# Patient Record
Sex: Female | Born: 1965 | Hispanic: No | Smoking: Current every day smoker
Health system: Southern US, Community
[De-identification: ages and names within clinical notes are randomized; demographics above are authoritative.]

## PROBLEM LIST (undated history)

## (undated) DIAGNOSIS — R569 Unspecified convulsions: Secondary | ICD-10-CM

## (undated) DIAGNOSIS — M1990 Unspecified osteoarthritis, unspecified site: Secondary | ICD-10-CM

## (undated) DIAGNOSIS — G4730 Sleep apnea, unspecified: Secondary | ICD-10-CM

## (undated) DIAGNOSIS — F32A Depression, unspecified: Secondary | ICD-10-CM

## (undated) DIAGNOSIS — M810 Age-related osteoporosis without current pathological fracture: Secondary | ICD-10-CM

## (undated) DIAGNOSIS — IMO0002 Reserved for concepts with insufficient information to code with codable children: Secondary | ICD-10-CM

## (undated) DIAGNOSIS — J449 Chronic obstructive pulmonary disease, unspecified: Secondary | ICD-10-CM

## (undated) DIAGNOSIS — F329 Major depressive disorder, single episode, unspecified: Secondary | ICD-10-CM

## (undated) DIAGNOSIS — F419 Anxiety disorder, unspecified: Secondary | ICD-10-CM

## (undated) DIAGNOSIS — G709 Myoneural disorder, unspecified: Secondary | ICD-10-CM

## (undated) HISTORY — DX: Depression, unspecified: F32.A

## (undated) HISTORY — DX: Sleep apnea, unspecified: G47.30

## (undated) HISTORY — DX: Unspecified osteoarthritis, unspecified site: M19.90

## (undated) HISTORY — DX: Reserved for concepts with insufficient information to code with codable children: IMO0002

## (undated) HISTORY — DX: Unspecified convulsions: R56.9

## (undated) HISTORY — DX: Myoneural disorder, unspecified: G70.9

## (undated) HISTORY — DX: Anxiety disorder, unspecified: F41.9

## (undated) HISTORY — DX: Major depressive disorder, single episode, unspecified: F32.9

## (undated) HISTORY — DX: Age-related osteoporosis without current pathological fracture: M81.0

## (undated) HISTORY — DX: Chronic obstructive pulmonary disease, unspecified: J44.9

---

## 2007-04-24 HISTORY — PX: ROTATOR CUFF REPAIR: SHX139

## 2016-06-15 ENCOUNTER — Ambulatory Visit (INDEPENDENT_AMBULATORY_CARE_PROVIDER_SITE_OTHER): Payer: Medicaid Other | Admitting: Family Medicine

## 2016-06-15 ENCOUNTER — Encounter: Payer: Self-pay | Admitting: Family Medicine

## 2016-06-15 VITALS — BP 130/82 | HR 98 | Temp 98.0°F | Ht 67.0 in | Wt 206.0 lb

## 2016-06-15 DIAGNOSIS — M797 Fibromyalgia: Secondary | ICD-10-CM

## 2016-06-15 DIAGNOSIS — G43909 Migraine, unspecified, not intractable, without status migrainosus: Secondary | ICD-10-CM

## 2016-06-15 DIAGNOSIS — M0579 Rheumatoid arthritis with rheumatoid factor of multiple sites without organ or systems involvement: Secondary | ICD-10-CM

## 2016-06-15 DIAGNOSIS — K5904 Chronic idiopathic constipation: Secondary | ICD-10-CM | POA: Diagnosis not present

## 2016-06-15 MED ORDER — FLUTICASONE-SALMETEROL 250-50 MCG/DOSE IN AEPB: 1.0000 | INHALATION_SPRAY | Freq: Two times a day (BID) | RESPIRATORY_TRACT | 5 refills | Status: AC

## 2016-06-15 MED ORDER — SERTRALINE HCL 100 MG PO TABS: 100.0000 mg | ORAL_TABLET | Freq: Every day | ORAL | 5 refills | Status: DC

## 2016-06-15 MED ORDER — FOLIC ACID 1 MG PO TABS: 1.0000 mg | ORAL_TABLET | Freq: Every day | ORAL | 5 refills | Status: AC

## 2016-06-15 MED ORDER — LINACLOTIDE 290 MCG PO CAPS: 290.0000 ug | ORAL_CAPSULE | Freq: Every day | ORAL | 5 refills | Status: DC

## 2016-06-15 MED ORDER — METHOTREXATE SODIUM 2.5 MG PO TABS: 2.5000 mg | ORAL_TABLET | ORAL | 5 refills | Status: DC

## 2016-06-15 MED ORDER — VITAMIN D 1000 UNITS PO TABS: 1000.0000 [IU] | ORAL_TABLET | Freq: Every day | ORAL | 5 refills | Status: AC

## 2016-06-15 MED ORDER — SIMVASTATIN 40 MG PO TABS: 40.0000 mg | ORAL_TABLET | Freq: Every day | ORAL | 5 refills | Status: DC

## 2016-06-15 MED ORDER — MELOXICAM 15 MG PO TABS: 15.0000 mg | ORAL_TABLET | Freq: Every day | ORAL | 5 refills | Status: DC

## 2016-06-15 MED ORDER — BUSPIRONE HCL 15 MG PO TABS: 15.0000 mg | ORAL_TABLET | Freq: Three times a day (TID) | ORAL | 5 refills | Status: DC

## 2016-06-15 MED ORDER — PANTOPRAZOLE SODIUM 40 MG PO TBEC: 40.0000 mg | DELAYED_RELEASE_TABLET | Freq: Every day | ORAL | 5 refills | Status: DC

## 2016-06-15 MED ORDER — FUROSEMIDE 40 MG PO TABS: 40.0000 mg | ORAL_TABLET | Freq: Every day | ORAL | 5 refills | Status: AC

## 2016-06-15 MED ORDER — ALBUTEROL SULFATE HFA 108 (90 BASE) MCG/ACT IN AERS: 1.0000 | INHALATION_SPRAY | Freq: Four times a day (QID) | RESPIRATORY_TRACT | 6 refills | Status: AC | PRN

## 2016-06-15 NOTE — Progress Notes (Signed)
Subjective:  Patient ID: Jessica Vaughn, female    DOB: 1965-08-25  Age: 51 y.o. MRN: [id number]  CC: New Patient (Initial Visit) (pt here today to establish care and needs referral to Rheumatologist.)   HPI Jessica Vaughn presents for Concern for pain of chronic disease - Rheumatoid arthritis and fibromyalgia. She has had the conditions for years. Is transfering care here. Needs to see rheumatology. Has taken methotrexate in the past. Is out. Doesn't help as much as it used to. Interested in trying the newer meds such as humira. Has not used embrel either. Pain is all over, but worst at the hands and shoulders. Rated 8/10. Aching sensation. No burning, numbness or cramping.   Ot. Also has problems with chonic constioation. She frequently goes two weeks without a BM in spite of use of multiple OTC remedies. These include dulcolax, miralax, probiotics, MOM, prunes, fiber drinks, correctol, citrucel, colace, mineral oil, senocot, and Mg Citrate.She hasn't had a colonoscopy. Abd. Feels bloated all the time.   History Jessica Vaughn has a past medical history of Anxiety; Arthritis; COPD (chronic obstructive pulmonary disease) ([place] [place]); Depression; Neuromuscular disorder ([place]); Osteoporosis; Seizures ([place]); Sleep apnea; and Ulcer ([place]).   She has a past surgical history that includes Rotator cuff repair (Left, 2009).   Her family history includes Cancer in her father; Depression in her mother; Diabetes in her father; Drug abuse in her sister; Hypertension in her father.She reports that she has been smoking Cigarettes.  She has never used smokeless tobacco. She reports that she drinks about 0.6 oz of alcohol per week . She reports that she does not use drugs.  No current outpatient prescriptions on file prior to visit.   No current facility-administered medications on file prior to visit.     ROS Review of Systems  Constitutional: Positive for fatigue. Negative for activity change, appetite change  and fever.  HENT: Negative for congestion, rhinorrhea and sore throat.   Eyes: Negative for visual disturbance.  Respiratory: Negative for cough and shortness of breath.   Cardiovascular: Negative for chest pain and palpitations.  Gastrointestinal: Positive for abdominal distention, abdominal pain and nausea. Negative for anal bleeding, blood in stool, diarrhea, rectal pain and vomiting.  Endocrine: Negative for cold intolerance and heat intolerance.  Genitourinary: Negative for dysuria and flank pain.  Musculoskeletal: Positive for arthralgias, back pain, gait problem, joint swelling and myalgias.  Neurological: Positive for weakness (generalized, nonfocal) and headaches. Negative for dizziness.    Objective:  BP 130/82   Pulse 98   Temp 98 F (36.7 C) (Oral)   Ht 5' 7"  (1.702 m)   Wt 206 lb (93.4 kg)   BMI 32.26 kg/m   Physical Exam  Constitutional: She is oriented to person, place, and time. She appears well-developed and well-nourished. No distress.  HENT:  Head: Normocephalic and atraumatic.  Right Ear: External ear normal.  Left Ear: External ear normal.  Nose: Nose normal.  Mouth/Throat: Oropharynx is clear and moist.  Eyes: Conjunctivae and EOM are normal. Pupils are equal, round, and reactive to light.  Neck: Normal range of motion. Neck supple. No thyromegaly present.  Cardiovascular: Normal rate, regular rhythm and normal heart sounds.   No murmur heard. Pulmonary/Chest: Effort normal and breath sounds normal. No respiratory distress. She has no wheezes. She has no rales.  Abdominal: Soft. Bowel sounds are normal. She exhibits no distension. There is no tenderness.  Lymphadenopathy:    She has no cervical adenopathy.  Neurological: She is alert and oriented  to person, place, and time. She has normal reflexes.  Skin: Skin is warm and dry.  Psychiatric: She has a normal mood and affect. Her behavior is normal. Judgment and thought content normal.    Assessment &  Plan:   Jessica Vaughn was seen today for new patient (initial visit).  Diagnoses and all orders for this visit:  Rheumatoid arthritis involving multiple sites with positive rheumatoid factor (HCC) -     CBC with Differential/Platelet -     CMP14+EGFR -     Folate -     Vitamin B12 -     VITAMIN D 25 Hydroxy (Vit-D Deficiency, Fractures) -     Sedimentation rate -     Rheumatoid factor -     Ambulatory referral to Rheumatology  Fibromyalgia -     CBC with Differential/Platelet -     CMP14+EGFR -     Folate -     Vitamin B12 -     VITAMIN D 25 Hydroxy (Vit-D Deficiency, Fractures) -     Sedimentation rate -     Rheumatoid factor  Migraine without status migrainosus, not intractable, unspecified migraine type  Chronic idiopathic constipation -     Ambulatory referral to Gastroenterology  Other orders -     albuterol (PROVENTIL HFA;VENTOLIN HFA) 108 (90 Base) MCG/ACT inhaler; Inhale 1 puff into the lungs every 6 (six) hours as needed for wheezing or shortness of breath. -     busPIRone (BUSPAR) 15 MG tablet; Take 1 tablet (15 mg total) by mouth 3 (three) times daily. -     cholecalciferol (VITAMIN D) 1000 units tablet; Take 1 tablet (1,000 Units total) by mouth daily. -     Fluticasone-Salmeterol (ADVAIR) 250-50 MCG/DOSE AEPB; Inhale 1 puff into the lungs 2 (two) times daily. -     folic acid (FOLVITE) 1 MG tablet; Take 1 tablet (1 mg total) by mouth daily. -     furosemide (LASIX) 40 MG tablet; Take 1 tablet (40 mg total) by mouth daily. -     meloxicam (MOBIC) 15 MG tablet; Take 1 tablet (15 mg total) by mouth daily. -     methotrexate 2.5 MG tablet; Take 1 tablet (2.5 mg total) by mouth once a week. Take 8 tablets once a week. -     pantoprazole (PROTONIX) 40 MG tablet; Take 1 tablet (40 mg total) by mouth daily. -     sertraline (ZOLOFT) 100 MG tablet; Take 1 tablet (100 mg total) by mouth daily. -     simvastatin (ZOCOR) 40 MG tablet; Take 1 tablet (40 mg total) by mouth  daily. -     linaclotide (LINZESS) 290 MCG CAPS capsule; Take 1 capsule (290 mcg total) by mouth daily. To regulate bowel movements   I have changed Jessica Vaughn's busPIRone, cholecalciferol, folic acid, furosemide, meloxicam, methotrexate, pantoprazole, sertraline, and simvastatin. I am also having her start on linaclotide. Additionally, I am having her maintain her SUMAtriptan, topiramate, traMADol, vitamin B-12, predniSONE, albuterol, and Fluticasone-Salmeterol.  Meds ordered this encounter  Medications  . DISCONTD: methotrexate 2.5 MG tablet    Sig: Take 2.5 mg by mouth once a week. Take 8 tablets once a week.  . SUMAtriptan (IMITREX) 50 MG tablet    Sig: Take 50 mg by mouth every 2 (two) hours as needed for migraine. May repeat in 2 hours if headache persists or recurs.  Marland Kitchen DISCONTD: busPIRone (BUSPAR) 15 MG tablet    Sig: Take 15 mg by mouth 3 (  three) times daily.  Marland Kitchen DISCONTD: folic acid (FOLVITE) 1 MG tablet    Sig: Take 1 mg by mouth daily.  Marland Kitchen topiramate (TOPAMAX) 25 MG tablet    Sig: Take 100 mg by mouth at bedtime.  Marland Kitchen DISCONTD: simvastatin (ZOCOR) 40 MG tablet    Sig: Take 40 mg by mouth daily.  Marland Kitchen DISCONTD: ondansetron (ZOFRAN-ODT) 4 MG disintegrating tablet    Sig: Take 4 mg by mouth every 8 (eight) hours as needed for nausea or vomiting.  . traMADol (ULTRAM) 50 MG tablet    Sig: Take 50 mg by mouth every 6 (six) hours as needed. Take 1-2 tablets every 6 hours as needed for pain.  Marland Kitchen DISCONTD: pantoprazole (PROTONIX) 40 MG tablet    Sig: Take 40 mg by mouth daily.  Marland Kitchen DISCONTD: meloxicam (MOBIC) 15 MG tablet    Sig: Take 15 mg by mouth daily.  . vitamin B-12 (CYANOCOBALAMIN) 1000 MCG tablet    Sig: Take 1,000 mcg by mouth daily.  . predniSONE (DELTASONE) 10 MG tablet    Sig: Take 10 mg by mouth daily with breakfast.  . DISCONTD: furosemide (LASIX) 40 MG tablet    Sig: Take 40 mg by mouth daily.  Marland Kitchen DISCONTD: cholecalciferol (VITAMIN D) 1000 units tablet    Sig: Take 1,000  Units by mouth daily.  Marland Kitchen DISCONTD: sertraline (ZOLOFT) 100 MG tablet    Sig: Take 100 mg by mouth daily.  Marland Kitchen DISCONTD: Fluticasone-Salmeterol (ADVAIR) 250-50 MCG/DOSE AEPB    Sig: Inhale 1 puff into the lungs 2 (two) times daily.  Marland Kitchen DISCONTD: albuterol (PROVENTIL HFA;VENTOLIN HFA) 108 (90 Base) MCG/ACT inhaler    Sig: Inhale 1 puff into the lungs every 6 (six) hours as needed for wheezing or shortness of breath.  Marland Kitchen albuterol (PROVENTIL HFA;VENTOLIN HFA) 108 (90 Base) MCG/ACT inhaler    Sig: Inhale 1 puff into the lungs every 6 (six) hours as needed for wheezing or shortness of breath.    Dispense:  1 Inhaler    Refill:  6  . busPIRone (BUSPAR) 15 MG tablet    Sig: Take 1 tablet (15 mg total) by mouth 3 (three) times daily.    Dispense:  30 tablet    Refill:  5  . cholecalciferol (VITAMIN D) 1000 units tablet    Sig: Take 1 tablet (1,000 Units total) by mouth daily.    Dispense:  30 tablet    Refill:  5  . Fluticasone-Salmeterol (ADVAIR) 250-50 MCG/DOSE AEPB    Sig: Inhale 1 puff into the lungs 2 (two) times daily.    Dispense:  60 each    Refill:  5  . folic acid (FOLVITE) 1 MG tablet    Sig: Take 1 tablet (1 mg total) by mouth daily.    Dispense:  30 tablet    Refill:  5  . furosemide (LASIX) 40 MG tablet    Sig: Take 1 tablet (40 mg total) by mouth daily.    Dispense:  30 tablet    Refill:  5  . meloxicam (MOBIC) 15 MG tablet    Sig: Take 1 tablet (15 mg total) by mouth daily.    Dispense:  30 tablet    Refill:  5  . methotrexate 2.5 MG tablet    Sig: Take 1 tablet (2.5 mg total) by mouth once a week. Take 8 tablets once a week.    Dispense:  32 tablet    Refill:  5  . pantoprazole (PROTONIX) 40 MG tablet  Sig: Take 1 tablet (40 mg total) by mouth daily.    Dispense:  30 tablet    Refill:  5  . sertraline (ZOLOFT) 100 MG tablet    Sig: Take 1 tablet (100 mg total) by mouth daily.    Dispense:  30 tablet    Refill:  5  . simvastatin (ZOCOR) 40 MG tablet    Sig: Take  1 tablet (40 mg total) by mouth daily.    Dispense:  30 tablet    Refill:  5  . linaclotide (LINZESS) 290 MCG CAPS capsule    Sig: Take 1 capsule (290 mcg total) by mouth daily. To regulate bowel movements    Dispense:  30 capsule    Refill:  5     Follow-up: Return in about 1 month (around 07/13/2016).  Claretta Fraise, M.D.

## 2016-06-16 LAB — CBC WITH DIFFERENTIAL/PLATELET
Basophils Absolute: 0.1 10*3/uL (ref 0.0–0.2)
Basos: 1 %
EOS (ABSOLUTE): 0.2 10*3/uL (ref 0.0–0.4)
EOS: 2 %
HEMATOCRIT: 42.3 % (ref 34.0–46.6)
HEMOGLOBIN: 14.3 g/dL (ref 11.1–15.9)
IMMATURE GRANS (ABS): 0 10*3/uL (ref 0.0–0.1)
IMMATURE GRANULOCYTES: 0 %
LYMPHS: 27 %
Lymphocytes Absolute: 2.5 10*3/uL (ref 0.7–3.1)
MCH: 31.7 pg (ref 26.6–33.0)
MCHC: 33.8 g/dL (ref 31.5–35.7)
MCV: 94 fL (ref 79–97)
MONOCYTES: 7 %
Monocytes Absolute: 0.7 10*3/uL (ref 0.1–0.9)
NEUTROS PCT: 63 %
Neutrophils Absolute: 5.7 10*3/uL (ref 1.4–7.0)
Platelets: 283 10*3/uL (ref 150–379)
RBC: 4.51 x10E6/uL (ref 3.77–5.28)
RDW: 16 % — AB (ref 12.3–15.4)
WBC: 9.1 10*3/uL (ref 3.4–10.8)

## 2016-06-16 LAB — CMP14+EGFR
ALT: 50 IU/L — ABNORMAL HIGH (ref 0–32)
AST: 29 IU/L (ref 0–40)
Albumin/Globulin Ratio: 1.9 (ref 1.2–2.2)
Albumin: 4.4 g/dL (ref 3.5–5.5)
Alkaline Phosphatase: 126 IU/L — ABNORMAL HIGH (ref 39–117)
BUN/Creatinine Ratio: 21 (ref 9–23)
BUN: 17 mg/dL (ref 6–24)
Bilirubin Total: 0.3 mg/dL (ref 0.0–1.2)
CALCIUM: 9.9 mg/dL (ref 8.7–10.2)
CO2: 26 mmol/L (ref 18–29)
CREATININE: 0.8 mg/dL (ref 0.57–1.00)
Chloride: 99 mmol/L (ref 96–106)
GFR calc Af Amer: 99 mL/min/{1.73_m2} (ref >59)
GFR, EST NON AFRICAN AMERICAN: 86 mL/min/{1.73_m2} (ref >59)
GLOBULIN, TOTAL: 2.3 g/dL (ref 1.5–4.5)
Glucose: 88 mg/dL (ref 65–99)
Potassium: 4.5 mmol/L (ref 3.5–5.2)
SODIUM: 141 mmol/L (ref 134–144)
Total Protein: 6.7 g/dL (ref 6.0–8.5)

## 2016-06-16 LAB — SEDIMENTATION RATE: Sed Rate: 3 mm/hr (ref 0–40)

## 2016-06-16 LAB — VITAMIN B12

## 2016-06-16 LAB — RHEUMATOID FACTOR: Rhuematoid fact SerPl-aCnc: 11.2 IU/mL (ref 0.0–13.9)

## 2016-06-16 LAB — VITAMIN D 25 HYDROXY (VIT D DEFICIENCY, FRACTURES): VIT D 25 HYDROXY: 25.2 ng/mL — AB (ref 30.0–100.0)

## 2016-06-16 LAB — FOLATE: Folate: >20 ng/mL (ref >3.0)

## 2016-06-17 ENCOUNTER — Encounter: Payer: Self-pay | Admitting: Family Medicine

## 2016-06-18 MED ORDER — ONDANSETRON 8 MG PO TBDP: 8.0000 mg | ORAL_TABLET | Freq: Three times a day (TID) | ORAL | 2 refills | Status: DC | PRN

## 2016-06-18 NOTE — Telephone Encounter (Signed)
I sent in meloxicam to replace ibuprofen. Was she able to try it?

## 2016-06-19 ENCOUNTER — Other Ambulatory Visit: Payer: Self-pay | Admitting: *Deleted

## 2016-06-20 ENCOUNTER — Encounter: Payer: Self-pay | Admitting: Family Medicine

## 2016-06-21 ENCOUNTER — Encounter: Payer: Self-pay | Admitting: Family Medicine

## 2016-06-21 ENCOUNTER — Other Ambulatory Visit: Payer: Self-pay | Admitting: Family Medicine

## 2016-06-21 MED ORDER — VITAMIN D (ERGOCALCIFEROL) 1.25 MG (50000 UNIT) PO CAPS: 50000.0000 [IU] | ORAL_CAPSULE | ORAL | 0 refills | Status: DC

## 2016-06-21 MED ORDER — IBUPROFEN 800 MG PO TABS: 800.0000 mg | ORAL_TABLET | Freq: Three times a day (TID) | ORAL | 2 refills | Status: DC | PRN

## 2016-06-25 ENCOUNTER — Encounter: Payer: Self-pay | Admitting: Internal Medicine

## 2016-06-27 ENCOUNTER — Encounter: Payer: Self-pay | Admitting: Family Medicine

## 2016-07-12 ENCOUNTER — Encounter: Payer: Self-pay | Admitting: Family Medicine

## 2016-07-12 ENCOUNTER — Ambulatory Visit (INDEPENDENT_AMBULATORY_CARE_PROVIDER_SITE_OTHER): Payer: Medicaid Other | Admitting: Family Medicine

## 2016-07-12 ENCOUNTER — Ambulatory Visit: Payer: Self-pay | Admitting: Nurse Practitioner

## 2016-07-12 VITALS — BP 107/75 | HR 85 | Temp 98.1°F | Ht 67.0 in | Wt 206.0 lb

## 2016-07-12 DIAGNOSIS — M5442 Lumbago with sciatica, left side: Secondary | ICD-10-CM

## 2016-07-12 DIAGNOSIS — M0579 Rheumatoid arthritis with rheumatoid factor of multiple sites without organ or systems involvement: Secondary | ICD-10-CM

## 2016-07-12 DIAGNOSIS — G8929 Other chronic pain: Secondary | ICD-10-CM

## 2016-07-12 DIAGNOSIS — M797 Fibromyalgia: Secondary | ICD-10-CM

## 2016-07-12 MED ORDER — METHOTREXATE SODIUM 2.5 MG PO TABS: 25.0000 mg | ORAL_TABLET | ORAL | 5 refills | Status: DC

## 2016-07-12 MED ORDER — DULOXETINE HCL 30 MG PO CPEP: ORAL_CAPSULE | ORAL | 1 refills | Status: DC

## 2016-07-12 NOTE — Progress Notes (Signed)
Subjective:  Patient ID: Jessica Vaughn, female    DOB: 04-May-1965  Age: 51 y.o. MRN: [id number]  CC: Rheumatoid Arthritis (pt here today for her follow up on her RA)   HPI [name] presents for Concern for pain of chronic disease - Rheumatoid arthritis and fibromyalgia. She has had the conditions for years. Is transfering care here Pain is all over, but worst at the hands and shoulders. Rated 8/10. Aching sensation. No burning, numbness or cramping. Patient reports having seen rheumatology. The physician told her that her problem was fibromyalgia. He did a rheumatoid test that was negative. He apparently did not consider her past rheumatoid tests and current severity of pain but said that the rheumatoid in the past must be quiet now. He took her off of her tramadol and meloxicam and started her on 30 mg a day of Cymbalta. He told her to continue the methotrexate under my care he is planning to see her in 4 months   Also has problems with chonic constipation.Symptoms have resolved almost entirely with the Linzess. History Nefeli has a past medical history of Anxiety; Arthritis; COPD (chronic obstructive pulmonary disease) (HCC); Depression; Neuromuscular disorder (HCC); Osteoporosis; Seizures (HCC); Sleep apnea; and Ulcer (HCC).   She has a past surgical history that includes Rotator cuff repair (Left, 2009).   Her family history includes Cancer in her father; Depression in her mother; Diabetes in her father; Drug abuse in her sister; Hypertension in her father.She reports that she has been smoking Cigarettes.  She has never used smokeless tobacco. She reports that she drinks about 0.6 oz of alcohol per week . She reports that she does not use drugs.  Current Outpatient Prescriptions on File Prior to Visit  Medication Sig Dispense Refill  . albuterol (PROVENTIL HFA;VENTOLIN HFA) 108 (90 Base) MCG/ACT inhaler Inhale 1 puff into the lungs every 6 (six) hours as needed for wheezing or  shortness of breath. 1 Inhaler 6  . cholecalciferol (VITAMIN D) 1000 units tablet Take 1 tablet (1,000 Units total) by mouth daily. 30 tablet 5  . Fluticasone-Salmeterol (ADVAIR) 250-50 MCG/DOSE AEPB Inhale 1 puff into the lungs 2 (two) times daily. 60 each 5  . folic acid (FOLVITE) 1 MG tablet Take 1 tablet (1 mg total) by mouth daily. 30 tablet 5  . furosemide (LASIX) 40 MG tablet Take 1 tablet (40 mg total) by mouth daily. 30 tablet 5  . ibuprofen (ADVIL,MOTRIN) 800 MG tablet Take 1 tablet (800 mg total) by mouth every 8 (eight) hours as needed. 90 tablet 2  . linaclotide (LINZESS) 290 MCG CAPS capsule Take 1 capsule (290 mcg total) by mouth daily. To regulate bowel movements 30 capsule 5  . ondansetron (ZOFRAN-ODT) 8 MG disintegrating tablet Take 1 tablet (8 mg total) by mouth every 8 (eight) hours as needed for nausea or vomiting. 60 tablet 2  . pantoprazole (PROTONIX) 40 MG tablet Take 1 tablet (40 mg total) by mouth daily. 30 tablet 5  . simvastatin (ZOCOR) 40 MG tablet Take 1 tablet (40 mg total) by mouth daily. 30 tablet 5  . SUMAtriptan (IMITREX) 50 MG tablet Take 50 mg by mouth every 2 (two) hours as needed for migraine. May repeat in 2 hours if headache persists or recurs.    . topiramate (TOPAMAX) 25 MG tablet Take 100 mg by mouth at bedtime.    . vitamin B-12 (CYANOCOBALAMIN) 1000 MCG tablet Take 500 mcg by mouth daily.    . Vitamin D, Ergocalciferol, (DRISDOL) 50000  units CAPS capsule Take 1 capsule (50,000 Units total) by mouth 2 (two) times a week. 16 capsule 0   No current facility-administered medications on file prior to visit.     ROS Review of Systems  Constitutional: Positive for fatigue. Negative for activity change, appetite change and fever.  HENT: Negative for congestion, rhinorrhea and sore throat.   Eyes: Negative for visual disturbance.  Respiratory: Negative for cough and shortness of breath.   Cardiovascular: Negative for chest pain and palpitations.    Gastrointestinal: Positive for abdominal distention, abdominal pain and nausea. Negative for anal bleeding, blood in stool, diarrhea, rectal pain and vomiting.  Endocrine: Negative for cold intolerance and heat intolerance.  Genitourinary: Negative for dysuria and flank pain.  Musculoskeletal: Positive for arthralgias, back pain, gait problem, joint swelling and myalgias.  Neurological: Positive for weakness (generalized, nonfocal) and headaches. Negative for dizziness.    Objective:  BP 107/75   Pulse 85   Temp 98.1 F (36.7 C) (Oral)   Ht 5\' 7"  (1.702 m)   Wt 206 lb (93.4 kg)   BMI 32.26 kg/m   Physical Exam  Constitutional: She is oriented to person, place, and time. She appears well-developed and well-nourished. No distress.  HENT:  Head: Normocephalic and atraumatic.  Right Ear: External ear normal.  Left Ear: External ear normal.  Nose: Nose normal.  Mouth/Throat: Oropharynx is clear and moist.  Eyes: Conjunctivae and EOM are normal. Pupils are equal, round, and reactive to light.  Neck: Normal range of motion. Neck supple. No thyromegaly present.  Cardiovascular: Normal rate, regular rhythm and normal heart sounds.   No murmur heard. Pulmonary/Chest: Effort normal and breath sounds normal. No respiratory distress. She has no wheezes. She has no rales.  Abdominal: Soft. Bowel sounds are normal. She exhibits no distension. There is no tenderness.  Lymphadenopathy:    She has no cervical adenopathy.  Neurological: She is alert and oriented to person, place, and time. She has normal reflexes.  Skin: Skin is warm and dry.  Psychiatric: She has a normal mood and affect. Her behavior is normal. Judgment and thought content normal.    Assessment & Plan:   Tyrone was seen today for rheumatoid arthritis.  Diagnoses and all orders for this visit:  Rheumatoid arthritis involving multiple sites with positive rheumatoid factor (HCC) -     Ambulatory referral to Pain  Clinic  Fibromyalgia -     Ambulatory referral to Pain Clinic  Chronic midline low back pain with left-sided sciatica -     Ambulatory referral to Pain Clinic  Other orders -     methotrexate 2.5 MG tablet; Take 10 tablets (25 mg total) by mouth once a week. -     DULoxetine (CYMBALTA) 30 MG capsule; Take 2 daily for 1 week. Then one in the morning and 2 in the evening for 1 week. Then take 2 morning and 2 in the evening daily   I have discontinued Ms. Kai's traMADol, predniSONE, busPIRone, meloxicam, and sertraline. I have also changed her methotrexate and DULoxetine. Additionally, I am having her maintain her SUMAtriptan, topiramate, vitamin B-12, albuterol, cholecalciferol, Fluticasone-Salmeterol, folic acid, furosemide, pantoprazole, simvastatin, linaclotide, ondansetron, Vitamin D (Ergocalciferol), and ibuprofen.  Meds ordered this encounter  Medications  . DISCONTD: DULoxetine (CYMBALTA) 30 MG capsule    Sig: Take 30 mg by mouth.  . methotrexate 2.5 MG tablet    Sig: Take 10 tablets (25 mg total) by mouth once a week.    Dispense:  40 tablet    Refill:  5  . DULoxetine (CYMBALTA) 30 MG capsule    Sig: Take 2 daily for 1 week. Then one in the morning and 2 in the evening for 1 week. Then take 2 morning and 2 in the evening daily    Dispense:  120 capsule    Refill:  1     Follow-up: Return in about 1 month (around 08/12/2016).  Mechele Claude, M.D.

## 2016-07-13 ENCOUNTER — Telehealth: Payer: Self-pay | Admitting: Family Medicine

## 2016-07-13 NOTE — Telephone Encounter (Signed)
What type of referral do you need? Pain mgt  Have you been seen at our office for this problem? yes (If no, schedule them an appointment.  They will need to be seen before a referral can be done.)  Is there a particular doctor or location that you prefer? Close by to [facility]  Patient notified that referrals can take up to a week or longer to process. If they haven't heard anything within a week they should call back and speak with the referral department.

## 2016-07-23 NOTE — Telephone Encounter (Signed)
Have spoken with pt/ multiple times concerning this referral. Referral faxed to [facility] and pt is aware of this

## 2016-07-25 ENCOUNTER — Ambulatory Visit: Payer: Self-pay | Admitting: Nurse Practitioner

## 2016-07-25 ENCOUNTER — Telehealth: Payer: Self-pay | Admitting: Family Medicine

## 2016-07-25 NOTE — Telephone Encounter (Signed)
Please contact the patient I can send in rednisone or celebrex if desired, but no opiates.

## 2016-07-25 NOTE — Telephone Encounter (Signed)
Patient states she has appointment with Dr Laurian Brim 4/18 and would like to know if she can get pain med until she see's pain clinic. Patient states she is having lower back pain that goes down left leg. She can not stand long and states she sits in the recliner all day due to the pain. Please advise and send back to the pools.

## 2016-07-25 NOTE — Telephone Encounter (Signed)
Aware of medication suggestions but she says she can't take either of them.   She is aware that no pain medications will be prescribed.

## 2016-08-10 ENCOUNTER — Other Ambulatory Visit: Payer: Self-pay | Admitting: *Deleted

## 2016-08-10 ENCOUNTER — Telehealth: Payer: Self-pay | Admitting: Family Medicine

## 2016-08-10 DIAGNOSIS — M5442 Lumbago with sciatica, left side: Secondary | ICD-10-CM

## 2016-08-10 DIAGNOSIS — M797 Fibromyalgia: Secondary | ICD-10-CM | POA: Insufficient documentation

## 2016-08-10 DIAGNOSIS — G8929 Other chronic pain: Secondary | ICD-10-CM | POA: Insufficient documentation

## 2016-08-10 DIAGNOSIS — G43909 Migraine, unspecified, not intractable, without status migrainosus: Secondary | ICD-10-CM | POA: Insufficient documentation

## 2016-08-10 NOTE — Telephone Encounter (Signed)
Please refer as requested. WS 

## 2016-08-10 NOTE — Telephone Encounter (Signed)
Patient aware that referral has been placed.  

## 2016-08-10 NOTE — Telephone Encounter (Signed)
What type of referral do you need? neurologist  Have you been seen at our office for this problem? Yes, we sent to pain clinic and they want [place] to get her a referral (If no, schedule them an appointment.  They will need to be seen before a referral can be done.)  Is there a particular doctor or location that you prefer? As close as possible  Patient notified that referrals can take up to a week or longer to process. If they haven't heard anything within a week they should call back and speak with the referral department.

## 2016-08-12 ENCOUNTER — Encounter: Payer: Self-pay | Admitting: Family Medicine

## 2016-08-12 DIAGNOSIS — H9209 Otalgia, unspecified ear: Secondary | ICD-10-CM

## 2016-08-13 ENCOUNTER — Other Ambulatory Visit: Payer: Self-pay

## 2016-08-13 ENCOUNTER — Encounter: Payer: Self-pay | Admitting: Nurse Practitioner

## 2016-08-13 ENCOUNTER — Ambulatory Visit (INDEPENDENT_AMBULATORY_CARE_PROVIDER_SITE_OTHER): Payer: Medicaid Other | Admitting: Nurse Practitioner

## 2016-08-13 ENCOUNTER — Encounter: Payer: Self-pay | Admitting: Neurology

## 2016-08-13 DIAGNOSIS — K649 Unspecified hemorrhoids: Secondary | ICD-10-CM | POA: Diagnosis not present

## 2016-08-13 DIAGNOSIS — K5900 Constipation, unspecified: Secondary | ICD-10-CM | POA: Diagnosis not present

## 2016-08-13 MED ORDER — PEG 3350-KCL-NA BICARB-NACL 420 G PO SOLR: 4000.0000 mL | ORAL | 0 refills | Status: AC

## 2016-08-13 MED ORDER — LUBIPROSTONE 24 MCG PO CAPS: 24.0000 ug | ORAL_CAPSULE | Freq: Two times a day (BID) | ORAL | 0 refills | Status: DC

## 2016-08-13 MED ORDER — HYDROCORTISONE 2.5 % RE CREA: 1.0000 "application " | TOPICAL_CREAM | Freq: Two times a day (BID) | RECTAL | 1 refills | Status: AC

## 2016-08-13 NOTE — Progress Notes (Signed)
cc'ed to pcp °

## 2016-08-13 NOTE — Progress Notes (Signed)
Primary Care Physician:  Mechele Claude, MD Primary Gastroenterologist:  Dr. Jena Gauss   Chief Complaint  Patient presents with  . Constipation    never had tcs, takes Linzess 290  . Hemorrhoids    some bleeding  . Rectal Pain    "up inside"    HPI:   Jessica Vaughn is a 51 y.o. female who presents On referral from primary care for chronic constipation. She saw primary care on 06/15/2016 for this problem at which point she noted chronic constipation and will frequently go 2 weeks without a bowel movement in spite of multiple over-the-counter medicines. Has tried and failed Dulcolax, MiraLAX, probiotics, milk of magnesia, prunes, fiber drinks, Correctol, Citrucel, Colace, mineral oil, Senokot, magnesium citrate. Has never had [place] colonoscopy prior. Abdomen feels bloated all the time.  Today she states she has chronic constipation for "many years." Is on Linzess 290 mcg which helps some, but not much. Has tried multiple OTC options. Occasional abdominal pain which occasionally improves after bowel movement. Will have a bowel movement about every couple weeks with hard stools and straining and followup stools are watery and consistent with overflow diarrhea. Eats lots of fiber and drinks a lot of water. Denies hematocheiza, melena, vomiting. Has significant bloating and nausea. Also with rectal pain which is intermittent and occasional. Has significant hemorrhoids and uses OTC Preparation H which helps sometimes. Denies chest pain, lightheadedness, syncope, near syncope. Denies any other upper or lower GI symptoms.  Has never had a colonoscopy before.  Past Medical History:  Diagnosis Date  . Anxiety   . Arthritis   . COPD (chronic obstructive pulmonary disease) (HCC)   . Depression   . Neuromuscular disorder (HCC)   . Osteoporosis   . Seizures (HCC)   . Sleep apnea   . Ulcer     Past Surgical History:  Procedure Laterality Date  . ROTATOR CUFF REPAIR Left 2009    Current  Outpatient Prescriptions  Medication Sig Dispense Refill  . albuterol (PROVENTIL HFA;VENTOLIN HFA) 108 (90 Base) MCG/ACT inhaler Inhale 1 puff into the lungs every 6 (six) hours as needed for wheezing or shortness of breath. 1 Inhaler 6  . baclofen (LIORESAL) 10 MG tablet Take 10 mg by mouth 3 (three) times daily.    . cholecalciferol (VITAMIN D) 1000 units tablet Take 1 tablet (1,000 Units total) by mouth daily. 30 tablet 5  . DULoxetine (CYMBALTA) 30 MG capsule Take 2 daily for 1 week. Then one in the morning and 2 in the evening for 1 week. Then take 2 morning and 2 in the evening daily 120 capsule 1  . Fluticasone-Salmeterol (ADVAIR) 250-50 MCG/DOSE AEPB Inhale 1 puff into the lungs 2 (two) times daily. 60 each 5  . folic acid (FOLVITE) 1 MG tablet Take 1 tablet (1 mg total) by mouth daily. 30 tablet 5  . furosemide (LASIX) 40 MG tablet Take 1 tablet (40 mg total) by mouth daily. 30 tablet 5  . ibuprofen (ADVIL,MOTRIN) 800 MG tablet Take 1 tablet (800 mg total) by mouth every 8 (eight) hours as needed. 90 tablet 2  . linaclotide (LINZESS) 290 MCG CAPS capsule Take 1 capsule (290 mcg total) by mouth daily. To regulate bowel movements 30 capsule 5  . methotrexate 2.5 MG tablet Take 10 tablets (25 mg total) by mouth once a week. 40 tablet 5  . ondansetron (ZOFRAN-ODT) 8 MG disintegrating tablet Take 1 tablet (8 mg total) by mouth every 8 (eight) hours as needed for nausea  or vomiting. 60 tablet 2  . pantoprazole (PROTONIX) 40 MG tablet Take 1 tablet (40 mg total) by mouth daily. 30 tablet 5  . pregabalin (LYRICA) 75 MG capsule Take 75 mg by mouth 2 (two) times daily.    . simvastatin (ZOCOR) 40 MG tablet Take 1 tablet (40 mg total) by mouth daily. 30 tablet 5  . SUMAtriptan (IMITREX) 50 MG tablet Take 50 mg by mouth every 2 (two) hours as needed for migraine. May repeat in 2 hours if headache persists or recurs.    . topiramate (TOPAMAX) 25 MG tablet Take 100 mg by mouth at bedtime.    . vitamin  B-12 (CYANOCOBALAMIN) 1000 MCG tablet Take 500 mcg by mouth daily.    . Vitamin D, Ergocalciferol, (DRISDOL) 50000 units CAPS capsule Take 1 capsule (50,000 Units total) by mouth 2 (two) times a week. 16 capsule 0   No current facility-administered medications for this visit.     Allergies as of 08/13/2016  . (No Known Allergies)    Family History  Problem Relation Age of Onset  . Depression Mother   . Cancer Father   . Diabetes Father   . Hypertension Father   . Drug abuse Sister   . Colon cancer Neg Hx     Social History   Social History  . Marital status: Divorced    Spouse name: N/A  . Number of children: N/A  . Years of education: N/A   Occupational History  . Not on file.   Social History Main Topics  . Smoking status: Current Every Day Smoker    Packs/day: 0.50    Types: Cigarettes  . Smokeless tobacco: Never Used  . Alcohol use No  . Drug use: No  . Sexual activity: Yes    Birth control/ protection: Post-menopausal   Other Topics Concern  . Not on file   Social History Narrative  . No narrative on file    Review of Systems: General: Negative for anorexia, weight loss, fever, chills, fatigue, weakness. ENT: Negative for hoarseness, difficulty swallowing. CV: Negative for chest pain, angina, palpitations, peripheral edema.  Respiratory: Negative for dyspnea at rest, cough, sputum, wheezing.  GI: See history of present illness. MS: Admits chronic pain.  Derm: Negative for rash or itching.  Endo: Negative for unusual weight change.  Heme: Negative for bruising or bleeding. Allergy: Negative for rash or hives.    Physical Exam: BP 104/76   Pulse 95   Temp 97.1 F (36.2 C) (Oral)   Ht 5\' 7"  (1.702 m)   Wt 206 lb 3.2 oz (93.5 kg)   BMI 32.30 kg/m  General:   Obese female. Alert and oriented. Pleasant and cooperative. Well-nourished and well-developed.  Head:  Normocephalic and atraumatic. Eyes:  Without icterus, sclera clear and conjunctiva  pink.  Ears:  Normal auditory acuity. Cardiovascular:  S1, S2 present without murmurs appreciated. Extremities without clubbing or edema. Respiratory:  Clear to auscultation bilaterally. No wheezes, rales, or rhonchi. No distress.  Gastrointestinal:  +BS, soft, and non-distended. Generalized abdominal TTP. No HSM noted. No guarding or rebound. No masses appreciated.  Rectal:  Deferred  Musculoskalatal:  Symmetrical without gross deformities. Neurologic:  Alert and oriented x4;  grossly normal neurologically. Psych:  Alert and cooperative. Normal mood and affect. Heme/Lymph/Immune: No excessive bruising noted.    08/13/2016 11:40 AM   Disclaimer: This note was dictated with voice recognition software. Similar sounding words can inadvertently be transcribed and may not be corrected upon review.

## 2016-08-13 NOTE — Assessment & Plan Note (Signed)
Tonic hemorrhoids for a number of years, since giving birth to her children, for which she takes Preparation H. She has associated rectal pain and occasionally bleeding. This is likely worsened by her constipation. Further constipation measures as per above. I will provide Anusol rectal cream to help with her symptoms. Colonoscopy as per below. Return for follow-up in 3 months.

## 2016-08-13 NOTE — Patient Instructions (Signed)
1. I have sent an Anusol rectal cream to your pharmacy. You can use this twice a day, up to 10 days at a time. 2. Stop taking Linzess. 3. Start taking Amitiza 24 g twice a day for constipation. Take this with food to help prevent nausea. 4. We will schedule your colonoscopy for you. 5. Further recommendations to be made based on the results of your colonoscopy. 6. Return for follow-up in 3 months.

## 2016-08-13 NOTE — Assessment & Plan Note (Signed)
The patient has chronic constipation for a number of years. She is not on any narcotics although she is on other medications such as Lyrica for chronic pain. She is currently on Linzess 290 g which is not helping significantly enough. She has tried and failed multiple over-the-counter options as per history of present illness. At this point I will have her stop Linzess and try him and is a 24 g twice a day with food. Return for follow-up in 3 months. Additionally, she is due for a colonoscopy at this time which is warranted especially given her symptoms of worsening abdominal pain, constipation (although chronic for a number years) and rectal bleeding (likely related to hemorrhoids) with rectal pain.  Proceed with TCS on propofol/MAC with Dr. Gala Romney in near future: the risks, benefits, and alternatives have been discussed with the patient in detail. The patient states understanding and desires to proceed.  The patient is currently on baclofen, Cymbalta, Lyrica, Topamax. No other anticoagulants, anxiolytics, chronic pain medications, or antidepressants. Additionally, she seems a bit high anxiety. I will plan for the procedure on propofol/MAC to promote adequate sedation given polypharmacy and anxiety.

## 2016-08-17 ENCOUNTER — Telehealth: Payer: Self-pay | Admitting: Family Medicine

## 2016-08-17 NOTE — Telephone Encounter (Signed)
Called pt, her FMLa will be done by middle of next week, she is ok with this.

## 2016-08-21 ENCOUNTER — Telehealth: Payer: Self-pay

## 2016-08-21 ENCOUNTER — Encounter: Payer: Self-pay | Admitting: Family Medicine

## 2016-08-21 ENCOUNTER — Telehealth: Payer: Self-pay | Admitting: *Deleted

## 2016-08-21 DIAGNOSIS — K649 Unspecified hemorrhoids: Secondary | ICD-10-CM

## 2016-08-21 DIAGNOSIS — K5900 Constipation, unspecified: Secondary | ICD-10-CM

## 2016-08-21 MED ORDER — LUBIPROSTONE 24 MCG PO CAPS: 24.0000 ug | ORAL_CAPSULE | Freq: Two times a day (BID) | ORAL | 5 refills | Status: DC

## 2016-08-21 NOTE — Telephone Encounter (Signed)
Returned patient's phone call.  Patient is calling to see if FMLA forms have been filled out.

## 2016-08-21 NOTE — Telephone Encounter (Signed)
I gave forms to [place], for [employer]

## 2016-08-21 NOTE — Telephone Encounter (Signed)
rx sent

## 2016-08-21 NOTE — Telephone Encounter (Signed)
Pt called- left voicemail- needs rx for amitiza sent to her pharmacy.

## 2016-08-23 NOTE — Telephone Encounter (Signed)
Noted  

## 2016-08-24 ENCOUNTER — Encounter: Payer: Self-pay | Admitting: Family Medicine

## 2016-08-27 ENCOUNTER — Telehealth: Payer: Self-pay | Admitting: Family Medicine

## 2016-08-27 NOTE — Telephone Encounter (Signed)
Pt made appt

## 2016-08-28 NOTE — Telephone Encounter (Signed)
Form filled out and signed by provider but pt advised she needs to sign prior to me faxing it in but she states she can't get here due to transportation. She states she will call them and let them know she couldn't make it in to sign it. She wanted me to fax it in without her signing it. Paperwork faxed as pt requested.

## 2016-08-29 ENCOUNTER — Ambulatory Visit: Payer: Medicaid Other | Admitting: Pediatrics

## 2016-09-10 ENCOUNTER — Ambulatory Visit (INDEPENDENT_AMBULATORY_CARE_PROVIDER_SITE_OTHER): Payer: Medicaid Other | Admitting: Neurology

## 2016-09-10 ENCOUNTER — Encounter: Payer: Self-pay | Admitting: Neurology

## 2016-09-10 VITALS — BP 106/70 | HR 113 | Ht 67.0 in | Wt 178.0 lb

## 2016-09-10 DIAGNOSIS — F17210 Nicotine dependence, cigarettes, uncomplicated: Secondary | ICD-10-CM

## 2016-09-10 DIAGNOSIS — G43709 Chronic migraine without aura, not intractable, without status migrainosus: Secondary | ICD-10-CM

## 2016-09-10 MED ORDER — ELETRIPTAN HYDROBROMIDE 40 MG PO TABS: ORAL_TABLET | ORAL | 2 refills | Status: DC

## 2016-09-10 MED ORDER — PROMETHAZINE HCL 25 MG PO TABS: 25.0000 mg | ORAL_TABLET | Freq: Four times a day (QID) | ORAL | 0 refills | Status: DC | PRN

## 2016-09-10 MED ORDER — PROPRANOLOL HCL ER 60 MG PO CP24: 60.0000 mg | ORAL_CAPSULE | Freq: Every day | ORAL | 2 refills | Status: AC

## 2016-09-10 NOTE — Progress Notes (Signed)
NEUROLOGY CONSULTATION NOTE  Jessica Vaughn MRN: [id number] DOB: 04/27/1965  Referring provider: Dr. Darlyn Read Primary care provider: Dr. Darlyn Read  Reason for consult:  migraine  HISTORY OF PRESENT ILLNESS: Jessica Vaughn is a 51 year old left-handed female with asthma, hyperlipidemia, fibromyalgia, rheumatoid arthritis, and remote history of childhood seizures who presents for migraine.  History supplemented by PCP note.  Onset:  Since 77 to 51 years old, but worse over past year. Location:  Bi-frontal and back of neck into shoulders. Quality:  throbbing Intensity:  10/10 Aura:  no Prodrome:  no Postdrome:  no Associated symptoms:  Nausea, photophobia, phonophobia, osmophobia, sometimes nausea.  She has not had any new worse headache of her life or thunderclap headache Duration:  Wakes up every morning with headache, lasting several hours till evening. Frequency:  daily Frequency of abortive medication: daily Triggers/exacerbating factors:  coughing Relieving factors:  Ice packs Activity:  aggravates  Past NSAIDS:  meloxicam Past analgesics:  tramadol (for pain) Past abortive triptans:  no Past muscle relaxants:  Baclofen, Flexeril, tizanidine (all muscle relaxants cause drowsiness) Past anti-emetic:  no Past antihypertensive medications:  no Past antidepressant medications:  sertraline 100mg  (for depression) Past anticonvulsant medications:  unknown Past vitamins/Herbal/Supplements:  no Past antihistamines/decongestants:  no  Current NSAIDS:  Ibuprofen 800mg , Aleve Current analgesics:  Excedrin, Tylenol Current triptans:  sumatriptan 50mg  (ineffective) Current anti-emetic:  Zofran (ineffective) Current muscle relaxants:  no Current anti-anxiolytic:  no Current sleep aide:  no Current Antihypertensive medications:  furosemide Current Antidepressant medications:  Cymbalta 60mg  twice daily Current Anticonvulsant medications:  topiramate 100mg , Lyrica 75mg  twice  daily Current Vitamins/Herbal/Supplements:  Mg, CoQ10, B12, D, folic acid Current Antihistamines/Decongestants:  no Other therapy:  Ice packs Other medication:  methotrexate  Caffeine:  1/2 cup coffee daily Alcohol:  no Smoker:  sometimes Diet:  Hydrates.  Salads.  No fried foods or sweets. Exercise:  5 miles a week (limited due to pain) Depression/anxiety:  yes Sleep hygiene:  Poor.  Pain keeps her up at night. Family history of headache:  Unknown.  She reports some blurred vision.  Recently had vision checked and needs new glasses.    06/15/16 LABS: CBC with WBC 9.1, HGB 14.3, HCT 42.3 and PLT 283; CMP with Na 141, K 4.5, Cl 99, CO2 26, glucose 88, BUN 17, Cr 0.80, total bili 0.3, ALP 126, AST 29 and ALT 50; B12 over 200, folate over 20; vitamin D 25.2; Sed rate 3, RF 11.2  PAST MEDICAL HISTORY: Past Medical History:  Diagnosis Date  . Anxiety   . Arthritis   . COPD (chronic obstructive pulmonary disease) (HCC)   . Depression   . Neuromuscular disorder (HCC)   . Osteoporosis   . Seizures (HCC)   . Sleep apnea   . Ulcer     PAST SURGICAL HISTORY: Past Surgical History:  Procedure Laterality Date  . ROTATOR CUFF REPAIR Left 2009    MEDICATIONS: Current Outpatient Prescriptions on File Prior to Visit  Medication Sig Dispense Refill  . albuterol (PROVENTIL HFA;VENTOLIN HFA) 108 (90 Base) MCG/ACT inhaler Inhale 1 puff into the lungs every 6 (six) hours as needed for wheezing or shortness of breath. 1 Inhaler 6  . baclofen (LIORESAL) 10 MG tablet Take 10 mg by mouth 3 (three) times daily.    . cholecalciferol (VITAMIN D) 1000 units tablet Take 1 tablet (1,000 Units total) by mouth daily. 30 tablet 5  . DULoxetine (CYMBALTA) 30 MG capsule Take 2 daily for 1 week. Then  one in the morning and 2 in the evening for 1 week. Then take 2 morning and 2 in the evening daily 120 capsule 1  . Fluticasone-Salmeterol (ADVAIR) 250-50 MCG/DOSE AEPB Inhale 1 puff into the lungs 2 (two) times  daily. 60 each 5  . folic acid (FOLVITE) 1 MG tablet Take 1 tablet (1 mg total) by mouth daily. 30 tablet 5  . furosemide (LASIX) 40 MG tablet Take 1 tablet (40 mg total) by mouth daily. 30 tablet 5  . hydrocortisone (ANUSOL-HC) 2.5 % rectal cream Place 1 application rectally 2 (two) times daily. 30 g 1  . ibuprofen (ADVIL,MOTRIN) 800 MG tablet Take 1 tablet (800 mg total) by mouth every 8 (eight) hours as needed. 90 tablet 2  . lubiprostone (AMITIZA) 24 MCG capsule Take 1 capsule (24 mcg total) by mouth 2 (two) times daily with a meal. 60 capsule 5  . methotrexate 2.5 MG tablet Take 10 tablets (25 mg total) by mouth once a week. 40 tablet 5  . pantoprazole (PROTONIX) 40 MG tablet Take 1 tablet (40 mg total) by mouth daily. 30 tablet 5  . polyethylene glycol-electrolytes (TRILYTE) 420 g solution Take 4,000 mLs by mouth as directed. 4000 mL 0  . pregabalin (LYRICA) 75 MG capsule Take 75 mg by mouth 2 (two) times daily.    . simvastatin (ZOCOR) 40 MG tablet Take 1 tablet (40 mg total) by mouth daily. 30 tablet 5  . vitamin B-12 (CYANOCOBALAMIN) 1000 MCG tablet Take 500 mcg by mouth daily.    . Vitamin D, Ergocalciferol, (DRISDOL) 50000 units CAPS capsule Take 1 capsule (50,000 Units total) by mouth 2 (two) times a week. 16 capsule 0   No current facility-administered medications on file prior to visit.     ALLERGIES: No Known Allergies  FAMILY HISTORY: Family History  Problem Relation Age of Onset  . Depression Mother   . Cancer Father   . Diabetes Father   . Hypertension Father   . Drug abuse Sister   . Colon cancer Neg Hx     SOCIAL HISTORY: Social History   Social History  . Marital status: Divorced    Spouse name: N/A  . Number of children: 2  . Years of education: N/A   Occupational History  . Not on file.   Social History Main Topics  . Smoking status: Current Every Day Smoker    Packs/day: 0.50    Types: Cigarettes  . Smokeless tobacco: Never Used  . Alcohol use  No  . Drug use: No  . Sexual activity: Yes    Birth control/ protection: Post-menopausal   Other Topics Concern  . Not on file   Social History Narrative   Lives with 2 sons, 1 is disabled. Moved her recently for [place].     REVIEW OF SYSTEMS: Constitutional: some fatigue.  No fevers, chills, or sweats, change in appetite Eyes: No visual changes, double vision, eye pain Ear, nose and throat: No hearing loss, ear pain, nasal congestion, sore throat Cardiovascular: No chest pain, palpitations Respiratory:  No shortness of breath at rest or with exertion, wheezes GastrointestinaI: No nausea, vomiting, diarrhea, abdominal pain, fecal incontinence Genitourinary:  No dysuria, urinary retention or frequency Musculoskeletal:  Neck pain, back pain Integumentary: No rash, pruritus, skin lesions Neurological: as above Psychiatric: depression, insomnia, anxiety Endocrine: some fatigue Hematologic/Lymphatic:  No purpura, petechiae. Allergic/Immunologic: no itchy/runny eyes, nasal congestion, recent allergic reactions, rashes  PHYSICAL EXAM: Vitals:   09/10/16 1015  BP: 106/70  Pulse: (!) 113   General: No acute distress.  Patient appears well-groomed.  Head:  Normocephalic/atraumatic Eyes:  fundi examined but not visualized Neck: supple, paraspinal tenderness, full range of motion Back: paraspinal tenderness Heart: regular rate and rhythm Lungs: Clear to auscultation bilaterally. Vascular: No carotid bruits. Neurological Exam: Mental status: alert and oriented to person, place, and time, recent and remote memory intact, fund of knowledge intact, attention and concentration intact, speech fluent and not dysarthric, language intact. Cranial nerves: CN I: not tested CN II: pupils equal, round and reactive to light, visual fields intact CN III, IV, VI:  full range of motion, no nystagmus, no ptosis CN V: facial sensation intact CN VII: upper and lower face symmetric CN VIII:  hearing intact CN IX, X: gag intact, uvula midline CN XI: sternocleidomastoid and trapezius muscles intact CN XII: tongue midline Bulk & Tone: normal, no fasciculations. Motor:  5/5 throughout Sensation:  Pinprick and vibration sensation reduced in all extremities up to above elbows and knees. Deep Tendon Reflexes:  2+ throughout, toes downgoing. Finger to nose testing:  Without dysmetria.  Heel to shin:  Without dysmetria.  Gait:  Antalgic gait, uses cane.  Able to turn but unable to tandem walk. Romberg negative.  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  Cigarette smoker  PLAN: 1.  Start propranolol ER 60mg  daily.  Advised to contact me in 4 weeks with update and we can increase dose if needed.  Instructed to monitor for lightheadedness. 2.  Stop all over the counter analgesics and NSAIDs.  Stop sumatriptan.  Instead, will try Relpax 40mg , limited to no more than 2 days out of the week to prevent rebound headache. 3.  Stop Zofran.  Instead, will start promethazine 25mg  for nausea. 4.  Will taper off of topiramate, since she reports no improvement. 5.  Consider evaluation for OSA.  At this point, most of her poor sleep hygiene seems related to her pain. 6. Discussed the importance of smoking cessation. 7.  Continue diet, hydration and try to increase exercise if possible 8.  Continue working with pain specialist. 9.  Follow up in 3 months.  Thank you for allowing me to take part in the care of this patient.  , DO  CC:  , MD

## 2016-09-10 NOTE — Patient Instructions (Signed)
Migraine Recommendations: 1.  Start propranolol ER 60mg  daily.  Call in 4 weeks with update and we can adjust dose if needed. 2.  Stop sumatriptan.  Instead, take eletriptan (Relpax) 40mg  at earliest onset of headache.  May repeat dose once in 2 hours if needed.  Do not exceed two tablets in 24 hours.  For nausea, take promethazine 25mg .  Stop the Zofran. 3.  Stop all other pain relievers (ibuprofen, Aleve, Tylenol, Excedrin).  Limit use of pain relievers to no more than 2 days out of the week.  These medications include acetaminophen, ibuprofen, triptans and narcotics.  This will help reduce risk of rebound headaches. 4.  Be aware of common food triggers such as processed sweets, processed foods with nitrites (such as deli meat, hot dogs, sausages), foods with MSG, alcohol (such as wine), chocolate, certain cheeses, certain fruits (dried fruits, some citrus fruit), vinegar, diet soda. 4.  Avoid caffeine 5.  Routine exercise 6.  Proper sleep hygiene 7.  Stay adequately hydrated with water 8.  Keep a headache diary. 9.  Maintain proper stress management. 10.  Do not skip meals. 11.  Consider supplements:  Magnesium citrate 400mg  to 600mg  daily, riboflavin 400mg , Coenzyme Q 10 100mg  three times daily 12.  We will stop topiramate.  Take 2 tablets daily for 1 week, then stop. 13.  Follow up in 3 months.

## 2016-09-12 ENCOUNTER — Encounter: Payer: Self-pay | Admitting: Family Medicine

## 2016-09-13 ENCOUNTER — Telehealth: Payer: Self-pay | Admitting: Emergency Medicine

## 2016-09-13 ENCOUNTER — Telehealth: Payer: Self-pay | Admitting: Neurology

## 2016-09-13 NOTE — Telephone Encounter (Signed)
PT called and said that Jessica Vaughn faxed over prescription authorization and she wanted to make sure that it gets signe and faxed back today/Jessica Vaughn

## 2016-09-13 NOTE — Telephone Encounter (Signed)
Started prior authorization for eletriptan 40 mg with [place] [employer]. Reference #[id number]

## 2016-09-13 NOTE — Telephone Encounter (Signed)
Looks like medication required a prior authorization which Desiree started today. Tried to call patient to make her aware with no answer.

## 2016-09-14 ENCOUNTER — Other Ambulatory Visit: Payer: Self-pay | Admitting: Family Medicine

## 2016-09-14 DIAGNOSIS — M545 Low back pain: Secondary | ICD-10-CM

## 2016-09-14 DIAGNOSIS — M79606 Pain in leg, unspecified: Secondary | ICD-10-CM

## 2016-09-14 NOTE — Telephone Encounter (Signed)
Please refer to ortho 

## 2016-09-14 NOTE — Telephone Encounter (Signed)
See pt. Response. Thanks, WS

## 2016-09-18 ENCOUNTER — Telehealth: Payer: Self-pay | Admitting: Neurology

## 2016-09-18 NOTE — Telephone Encounter (Signed)
Dr Everlena Cooper, patient is waiting on an approval from [facility] for her relpax, an authorization was done on 5/24 but we do not have a response yet. She said she is having severe headaches and wants to know what she is suppose to do in the meantime  Is there another med we can send in for her?

## 2016-09-18 NOTE — Telephone Encounter (Signed)
Caller: Jessica Vaughn    Urgent? Yes  Reason for the call: She was calling regarding a prior authorization that was started on 09/13/16. She said she has been having headaches everyday and needs her medication. Please call. Thanks

## 2016-09-19 MED ORDER — NAPROXEN 500 MG PO TABS: ORAL_TABLET | ORAL | 1 refills | Status: AC

## 2016-09-19 MED ORDER — SUMATRIPTAN SUCCINATE 100 MG PO TABS: 100.0000 mg | ORAL_TABLET | Freq: Once | ORAL | 2 refills | Status: AC | PRN

## 2016-09-19 NOTE — Telephone Encounter (Signed)
We can prescribe her increased dose of sumatriptan.  She was taking 50mg .  I would like to prescribe her 100mg  along with naproxen 500mg .  She should take 1 sumatriptan and 1 naproxen tablet at earliest onset of headache and may repeat once in 2 hours if needed.  In the meantime, could we follow up on pre-approval for relpax?  Note we cannot use Maxalt, as she is taking propranolol (there may be adverse effects when using both medications).

## 2016-09-19 NOTE — Telephone Encounter (Signed)
Prescriptions sent to pharmacy. Patient instructed on how to take the medications.   Patient's authorization through [facility], they state it can take up to two weeks for approval.

## 2016-09-20 ENCOUNTER — Telehealth: Payer: Self-pay

## 2016-09-20 NOTE — Telephone Encounter (Signed)
Submitted prior authorization for Relpax through Cover my Meds and [place] [employer] (for Medicaid).  Being sent to pharmacy techs to review.  Anticipate answer in 24 hours; PA #[id number]; PJ#[id number]

## 2016-09-20 NOTE — Patient Instructions (Signed)
Jessica Vaughn  09/20/2016     @PREFPERIOPPHARMACY @   Your procedure is scheduled on  09/27/2016   Report to [facility] at  1130  A.M.  Call this number if you have problems the morning of surgery:  [phone]   Remember:  Do not eat food or drink liquids after midnight.  Take these medicines the morning of surgery with A SIP OF WATER cymbalta, protonix, lyrica, phenergan, propranolol. Use your inhaler before you come.   Do not wear jewelry, make-up or nail polish.  Do not wear lotions, powders, or perfumes, or deoderant.  Do not shave 48 hours prior to surgery.  Men may shave face and neck.  Do not bring valuables to the hospital.  [facility] is not responsible for any belongings or valuables.  Contacts, dentures or bridgework may not be worn into surgery.  Leave your suitcase in the car.  After surgery it may be brought to your room.  For patients admitted to the hospital, discharge time will be determined by your treatment team.  Patients discharged the day of surgery will not be allowed to drive home.   Name and phone number of your driver:   family Special instructions:  Follow the diet and prep instructions given to you by Dr Luvenia Starch office.  Please read over the following fact sheets that you were given. Anesthesia Post-op Instructions and Care and Recovery After Surgery       Colonoscopy, Adult A colonoscopy is an exam to look at the entire large intestine. During the exam, a lubricated, bendable tube is inserted into the anus and then passed into the rectum, colon, and other parts of the large intestine. A colonoscopy is often done as a part of normal colorectal screening or in response to certain symptoms, such as anemia, persistent diarrhea, abdominal pain, and blood in the stool. The exam can help screen for and diagnose medical problems, including:  Tumors.  Polyps.  Inflammation.  Areas of bleeding.  Tell a health care provider  about:  Any allergies you have.  All medicines you are taking, including vitamins, herbs, eye drops, creams, and over-the-counter medicines.  Any problems you or family members have had with anesthetic medicines.  Any blood disorders you have.  Any surgeries you have had.  Any medical conditions you have.  Any problems you have had passing stool. What are the risks? Generally, this is a safe procedure. However, problems may occur, including:  Bleeding.  A tear in the intestine.  A reaction to medicines given during the exam.  Infection (rare).  What happens before the procedure? Eating and drinking restrictions Follow instructions from your health care provider about eating and drinking, which may include:  A few days before the procedure - follow a low-fiber diet. Avoid nuts, seeds, dried fruit, raw fruits, and vegetables.  1-3 days before the procedure - follow a clear liquid diet. Drink only clear liquids, such as clear broth or bouillon, black coffee or tea, clear juice, clear soft drinks or sports drinks, gelatin dessert, and popsicles. Avoid any liquids that contain red or purple dye.  On the day of the procedure - do not eat or drink anything during the 2 hours before the procedure, or within the time period that your health care provider recommends.  Bowel prep If you were prescribed an oral bowel prep to clean out your colon:  Take it as told by your health care provider.  Starting the day before your procedure, you will need to drink a large amount of medicated liquid. The liquid will cause you to have multiple loose stools until your stool is almost clear or light green.  If your skin or anus gets irritated from diarrhea, you may use these to relieve the irritation: ? Medicated wipes, such as adult wet wipes with aloe and vitamin E. ? A skin soothing-product like petroleum jelly.  If you vomit while drinking the bowel prep, take a break for up to 60 minutes and  then begin the bowel prep again. If vomiting continues and you cannot take the bowel prep without vomiting, call your health care provider.  General instructions  Ask your health care provider about changing or stopping your regular medicines. This is especially important if you are taking diabetes medicines or blood thinners.  Plan to have someone take you home from the hospital or clinic. What happens during the procedure?  An IV tube may be inserted into one of your veins.  You will be given medicine to help you relax (sedative).  To reduce your risk of infection: ? Your health care team will wash or sanitize their hands. ? Your anal area will be washed with soap.  You will be asked to lie on your side with your knees bent.  Your health care provider will lubricate a long, thin, flexible tube. The tube will have a camera and a light on the end.  The tube will be inserted into your anus.  The tube will be gently eased through your rectum and colon.  Air will be delivered into your colon to keep it open. You may feel some pressure or cramping.  The camera will be used to take images during the procedure.  A small tissue sample may be removed from your body to be examined under a microscope (biopsy). If any potential problems are found, the tissue will be sent to a lab for testing.  If small polyps are found, your health care provider may remove them and have them checked for cancer cells.  The tube that was inserted into your anus will be slowly removed. The procedure may vary among health care providers and hospitals. What happens after the procedure?  Your blood pressure, heart rate, breathing rate, and blood oxygen level will be monitored until the medicines you were given have worn off.  Do not drive for 24 hours after the exam.  You may have a small amount of blood in your stool.  You may pass gas and have mild abdominal cramping or bloating due to the air that was  used to inflate your colon during the exam.  It is up to you to get the results of your procedure. Ask your health care provider, or the department performing the procedure, when your results will be ready. This information is not intended to replace advice given to you by your health care provider. Make sure you discuss any questions you have with your health care provider. Document Released: 04/06/2000 Document Revised: 02/08/2016 Document Reviewed: 06/21/2015 Elsevier Interactive Patient Education  2018 [employer].  Colonoscopy, Adult, Care After This sheet gives you information about how to care for yourself after your procedure. Your health care provider may also give you more specific instructions. If you have problems or questions, contact your health care provider. What can I expect after the procedure? After the procedure, it is common to have:  A small amount of blood in your stool for  24 hours after the procedure.  Some gas.  Mild abdominal cramping or bloating.  Follow these instructions at home: General instructions   For the first 24 hours after the procedure: ? Do not drive or use machinery. ? Do not sign important documents. ? Do not drink alcohol. ? Do your regular daily activities at a slower pace than normal. ? Eat soft, easy-to-digest foods. ? Rest often.  Take over-the-counter or prescription medicines only as told by your health care provider.  It is up to you to get the results of your procedure. Ask your health care provider, or the department performing the procedure, when your results will be ready. Relieving cramping and bloating  Try walking around when you have cramps or feel bloated.  Apply heat to your abdomen as told by your health care provider. Use a heat source that your health care provider recommends, such as a moist heat pack or a heating pad. ? Place a towel between your skin and the heat source. ? Leave the heat on for 20-30  minutes. ? Remove the heat if your skin turns bright red. This is especially important if you are unable to feel pain, heat, or cold. You may have a greater risk of getting burned. Eating and drinking  Drink enough fluid to keep your urine clear or pale yellow.  Resume your normal diet as instructed by your health care provider. Avoid heavy or fried foods that are hard to digest.  Avoid drinking alcohol for as long as instructed by your health care provider. Contact a health care provider if:  You have blood in your stool 2-3 days after the procedure. Get help right away if:  You have more than a small spotting of blood in your stool.  You pass large blood clots in your stool.  Your abdomen is swollen.  You have nausea or vomiting.  You have a fever.  You have increasing abdominal pain that is not relieved with medicine. This information is not intended to replace advice given to you by your health care provider. Make sure you discuss any questions you have with your health care provider. Document Released: 11/22/2003 Document Revised: 01/02/2016 Document Reviewed: 06/21/2015 Elsevier Interactive Patient Education  2018 [employer]  Monitored Anesthesia Care Anesthesia is a term that refers to techniques, procedures, and medicines that help a person stay safe and comfortable during a medical procedure. Monitored anesthesia care, or sedation, is one type of anesthesia. Your anesthesia specialist may recommend sedation if you will be having a procedure that does not require you to be unconscious, such as:  Cataract surgery.  A dental procedure.  A biopsy.  A colonoscopy.  During the procedure, you may receive a medicine to help you relax (sedative). There are three levels of sedation:  Mild sedation. At this level, you may feel awake and relaxed. You will be able to follow directions.  Moderate sedation. At this level, you will be sleepy. You may not remember the  procedure.  Deep sedation. At this level, you will be asleep. You will not remember the procedure.  The more medicine you are given, the deeper your level of sedation will be. Depending on how you respond to the procedure, the anesthesia specialist may change your level of sedation or the type of anesthesia to fit your needs. An anesthesia specialist will monitor you closely during the procedure. Let your health care provider know about:  Any allergies you have.  All medicines you are taking, including  vitamins, herbs, eye drops, creams, and over-the-counter medicines.  Any use of steroids (by mouth or as a cream).  Any problems you or family members have had with sedatives and anesthetic medicines.  Any blood disorders you have.  Any surgeries you have had.  Any medical conditions you have, such as sleep apnea.  Whether you are pregnant or may be pregnant.  Any use of cigarettes, alcohol, or street drugs. What are the risks? Generally, this is a safe procedure. However, problems may occur, including:  Getting too much medicine (oversedation).  Nausea.  Allergic reaction to medicines.  Trouble breathing. If this happens, a breathing tube may be used to help with breathing. It will be removed when you are awake and breathing on your own.  Heart trouble.  Lung trouble.  Before the procedure Staying hydrated Follow instructions from your health care provider about hydration, which may include:  Up to 2 hours before the procedure - you may continue to drink clear liquids, such as water, clear fruit juice, black coffee, and plain tea.  Eating and drinking restrictions Follow instructions from your health care provider about eating and drinking, which may include:  8 hours before the procedure - stop eating heavy meals or foods such as meat, fried foods, or fatty foods.  6 hours before the procedure - stop eating light meals or foods, such as toast or cereal.  6 hours  before the procedure - stop drinking milk or drinks that contain milk.  2 hours before the procedure - stop drinking clear liquids.  Medicines Ask your health care provider about:  Changing or stopping your regular medicines. This is especially important if you are taking diabetes medicines or blood thinners.  Taking medicines such as aspirin and ibuprofen. These medicines can thin your blood. Do not take these medicines before your procedure if your health care provider instructs you not to.  Tests and exams  You will have a physical exam.  You may have blood tests done to show: ? How well your kidneys and liver are working. ? How well your blood can clot.  General instructions  Plan to have someone take you home from the hospital or clinic.  If you will be going home right after the procedure, plan to have someone with you for 24 hours.  What happens during the procedure?  Your blood pressure, heart rate, breathing, level of pain and overall condition will be monitored.  An IV tube will be inserted into one of your veins.  Your anesthesia specialist will give you medicines as needed to keep you comfortable during the procedure. This may mean changing the level of sedation.  The procedure will be performed. After the procedure  Your blood pressure, heart rate, breathing rate, and blood oxygen level will be monitored until the medicines you were given have worn off.  Do not drive for 24 hours if you received a sedative.  You may: ? Feel sleepy, clumsy, or nauseous. ? Feel forgetful about what happened after the procedure. ? Have a sore throat if you had a breathing tube during the procedure. ? Vomit. This information is not intended to replace advice given to you by your health care provider. Make sure you discuss any questions you have with your health care provider. Document Released: 01/03/2005 Document Revised: 09/16/2015 Document Reviewed: 07/31/2015 Elsevier  Interactive Patient Education  2018 [employer] Monitored Anesthesia Care, Care After These instructions provide you with information about caring for yourself after your procedure.  Your health care provider may also give you more specific instructions. Your treatment has been planned according to current medical practices, but problems sometimes occur. Call your health care provider if you have any problems or questions after your procedure. What can I expect after the procedure? After your procedure, it is common to:  Feel sleepy for several hours.  Feel clumsy and have poor balance for several hours.  Feel forgetful about what happened after the procedure.  Have poor judgment for several hours.  Feel nauseous or vomit.  Have a sore throat if you had a breathing tube during the procedure.  Follow these instructions at home: For at least 24 hours after the procedure:   Do not: ? Participate in activities in which you could fall or become injured. ? Drive. ? Use heavy machinery. ? Drink alcohol. ? Take sleeping pills or medicines that cause drowsiness. ? Make important decisions or sign legal documents. ? Take care of children on your own.  Rest. Eating and drinking  Follow the diet that is recommended by your health care provider.  If you vomit, drink water, juice, or soup when you can drink without vomiting.  Make sure you have little or no nausea before eating solid foods. General instructions  Have a responsible adult stay with you until you are awake and alert.  Take over-the-counter and prescription medicines only as told by your health care provider.  If you smoke, do not smoke without supervision.  Keep all follow-up visits as told by your health care provider. This is important. Contact a health care provider if:  You keep feeling nauseous or you keep vomiting.  You feel light-headed.  You develop a rash.  You have a fever. Get help right away  if:  You have trouble breathing. This information is not intended to replace advice given to you by your health care provider. Make sure you discuss any questions you have with your health care provider. Document Released: 07/31/2015 Document Revised: 11/30/2015 Document Reviewed: 07/31/2015 Elsevier Interactive Patient Education  [employer].

## 2016-09-21 ENCOUNTER — Telehealth: Payer: Self-pay

## 2016-09-21 NOTE — Telephone Encounter (Signed)
Endo Scheduler called office, pt had left message on her voicemail that she needed to reschedule Colonoscopy that is for 09/27/16. Called pt, she needs to cancel colonoscopy d/t she has been sick. Informed her she will need OV to reschedule procedure. OV with EG scheduled for 11/07/16.  Routing to EG as FYI.

## 2016-09-21 NOTE — Telephone Encounter (Signed)
Noted  

## 2016-09-24 ENCOUNTER — Telehealth: Payer: Self-pay | Admitting: Neurology

## 2016-09-24 ENCOUNTER — Inpatient Hospital Stay (HOSPITAL_COMMUNITY)
Admission: RE | Admit: 2016-09-24 | Discharge: 2016-09-24 | Disposition: A | Discharge status: Home / Self Care | Payer: Medicaid Other | Source: Ambulatory Visit

## 2016-09-24 ENCOUNTER — Encounter (HOSPITAL_COMMUNITY): Payer: Self-pay

## 2016-09-24 NOTE — Telephone Encounter (Signed)
Patient would like to speak to someone about her paperwork for work and her migraines

## 2016-09-24 NOTE — Telephone Encounter (Signed)
Left detailed voicemail for patient explaining this.

## 2016-09-24 NOTE — Telephone Encounter (Signed)
Spoke to patient, she has been out of work since 5/3 and is continually getting worse. She reports frequent headaches and forgetfulness and disoriented to things like what day of the week it is and what she is supposed to be doing. Patient reports that her job should be sending paperwork for her absences and she also is working with a Regulatory affairs officer. The claim number for her paperwork is [id number] and the fax number is [phone] it should come from a company called [employer]. She has a follow up on 10/17/16 with Dr. Everlena Cooper.

## 2016-09-24 NOTE — Telephone Encounter (Signed)
1.  I don't fill out FMLA after seeing a patient only once (and after just one month, that is not giving enough time to reassess). 2.  I do not fill out disability for migraines.  If there are forms that need to be filled at this time, I recommend seeing her PCP

## 2016-09-27 ENCOUNTER — Encounter (HOSPITAL_COMMUNITY): Admission: RE | Payer: Self-pay | Source: Ambulatory Visit

## 2016-09-27 ENCOUNTER — Ambulatory Visit (HOSPITAL_COMMUNITY): Admission: RE | Admit: 2016-09-27 | Payer: Medicaid Other | Source: Ambulatory Visit | Admitting: Internal Medicine

## 2016-09-27 SURGERY — COLONOSCOPY WITH PROPOFOL
Anesthesia: Monitor Anesthesia Care

## 2016-09-28 NOTE — Telephone Encounter (Signed)
Wasn't sure if this prior authorization was ever approved.

## 2016-10-04 ENCOUNTER — Encounter: Payer: Self-pay | Admitting: Family Medicine

## 2016-10-04 ENCOUNTER — Ambulatory Visit (INDEPENDENT_AMBULATORY_CARE_PROVIDER_SITE_OTHER): Payer: Medicaid Other | Admitting: Family Medicine

## 2016-10-04 VITALS — BP 103/68 | HR 94 | Temp 98.1°F | Ht 67.0 in | Wt 210.4 lb

## 2016-10-04 DIAGNOSIS — R059 Cough, unspecified: Secondary | ICD-10-CM

## 2016-10-04 DIAGNOSIS — R05 Cough: Secondary | ICD-10-CM

## 2016-10-04 DIAGNOSIS — M546 Pain in thoracic spine: Secondary | ICD-10-CM | POA: Diagnosis not present

## 2016-10-04 MED ORDER — AMOXICILLIN-POT CLAVULANATE 875-125 MG PO TABS: 1.0000 | ORAL_TABLET | Freq: Two times a day (BID) | ORAL | 0 refills | Status: DC

## 2016-10-04 MED ORDER — PREDNISONE 20 MG PO TABS: ORAL_TABLET | ORAL | 0 refills | Status: DC

## 2016-10-04 NOTE — Patient Instructions (Signed)
Great to see you!  Be sure to finish all antibiotics

## 2016-10-04 NOTE — Progress Notes (Signed)
   HPI  Patient presents today here with cough and back pain.  Cough Patient reports approximately 2 weeks of cough and nasal congestion, she is a smoker.   She also complains of left-sided thoracic back pain. This is been going on for about 3-4 weeks. She also has low back pain with radiation to the left leg. Her low back pain is more of a chronic issue.  She has had some chills, subjective fever, and fatigue. She's been taking ibuprofen and all oral medications without much improvement She has also tried guaifenesin over-the-counter though improvement.   PMH: Smoking status noted ROS: Per HPI  Objective: BP 103/68   Pulse 94   Temp 98.1 F (36.7 C) (Oral)   Ht 5\' 7"  (1.702 m)   Wt 210 lb 6.4 oz (95.4 kg)   BMI 32.95 kg/m  Gen: NAD, alert, cooperative with exam HEENT: NCAT CV: RRR, good S1/S2, no murmur Resp: Persistent wheezy cough with deep inspiration, some expiratory wheezes scattered, nonlabored, good air movement Abd: SNTND, BS present, no guarding or organomegaly Ext: No edema, warm Neuro: Alert and oriented, No gross deficits  Assessment and plan:  # Cough Patient is a smoker, I am concerned for some underlying mild or developing COPD. Recommended smoking cessation Treat prednisone plus Augmentin   # Acute left-sided thoracic back pain Unclear etiology, however most likely muscle spasm Patient has tried NSAIDs plus muscle relaxers would not not much improvement Prednisone should help this.   Patient does have left lower leg pain, she was concerned about a DVT, she denies any history of DVT. Her left calf circumference is 15 and 7/8 inches, her right calf circumference is 16-1/4 inch, she has a negative Homans sign, and no asymmetric swelling. I recommended deferring any workup for DVT today, I believe she is low risk for DVT at this time.   Meds ordered this encounter  Medications  . amoxicillin-clavulanate (AUGMENTIN) 875-125 MG tablet    Sig: Take 1  tablet by mouth 2 (two) times daily.    Dispense:  20 tablet    Refill:  0  . predniSONE (DELTASONE) 20 MG tablet    Sig: 2 po at sametime daily for 5 days    Dispense:  10 tablet    Refill:  0    , MD Murtis Sink [facility] 10/04/2016, 1:51 PM

## 2016-10-06 ENCOUNTER — Other Ambulatory Visit: Payer: Self-pay | Admitting: Neurology

## 2016-10-06 ENCOUNTER — Other Ambulatory Visit: Payer: Self-pay | Admitting: Family Medicine

## 2016-10-06 ENCOUNTER — Other Ambulatory Visit: Payer: Self-pay | Admitting: Internal Medicine

## 2016-10-08 MED ORDER — PROMETHAZINE HCL 25 MG PO TABS: 25.0000 mg | ORAL_TABLET | Freq: Four times a day (QID) | ORAL | 0 refills | Status: AC | PRN

## 2016-10-11 ENCOUNTER — Encounter: Payer: Self-pay | Admitting: Family Medicine

## 2016-10-11 MED ORDER — FLUCONAZOLE 150 MG PO TABS: ORAL_TABLET | ORAL | 0 refills | Status: DC

## 2016-10-12 ENCOUNTER — Telehealth: Payer: Self-pay | Admitting: Family Medicine

## 2016-10-12 NOTE — Telephone Encounter (Signed)
MAde appt to be seen

## 2016-10-15 ENCOUNTER — Telehealth: Payer: Self-pay | Admitting: Family Medicine

## 2016-10-15 NOTE — Telephone Encounter (Signed)
Please address

## 2016-10-15 NOTE — Telephone Encounter (Signed)
Lm 6/25-jhb

## 2016-10-15 NOTE — Telephone Encounter (Signed)
Refer to ENT

## 2016-10-16 ENCOUNTER — Encounter: Payer: Self-pay | Admitting: Family Medicine

## 2016-10-17 ENCOUNTER — Ambulatory Visit: Payer: Medicaid Other | Admitting: Neurology

## 2016-10-18 ENCOUNTER — Other Ambulatory Visit: Payer: Self-pay | Admitting: Family Medicine

## 2016-10-18 ENCOUNTER — Encounter: Payer: Self-pay | Admitting: Family Medicine

## 2016-10-18 DIAGNOSIS — E669 Obesity, unspecified: Secondary | ICD-10-CM

## 2016-10-19 ENCOUNTER — Ambulatory Visit: Payer: Medicaid Other | Admitting: Family Medicine

## 2016-10-26 ENCOUNTER — Other Ambulatory Visit: Payer: Self-pay | Admitting: *Deleted

## 2016-10-26 DIAGNOSIS — H9191 Unspecified hearing loss, right ear: Secondary | ICD-10-CM

## 2016-10-26 NOTE — Telephone Encounter (Signed)
Referral to ENT placed and patient aware

## 2016-10-30 ENCOUNTER — Ambulatory Visit (INDEPENDENT_AMBULATORY_CARE_PROVIDER_SITE_OTHER): Payer: Medicaid Other

## 2016-10-30 ENCOUNTER — Encounter (INDEPENDENT_AMBULATORY_CARE_PROVIDER_SITE_OTHER): Payer: Self-pay | Admitting: Orthopaedic Surgery

## 2016-10-30 ENCOUNTER — Ambulatory Visit (INDEPENDENT_AMBULATORY_CARE_PROVIDER_SITE_OTHER): Payer: Medicaid Other | Admitting: Orthopaedic Surgery

## 2016-10-30 VITALS — Ht 67.0 in | Wt 212.0 lb

## 2016-10-30 DIAGNOSIS — M5442 Lumbago with sciatica, left side: Secondary | ICD-10-CM | POA: Diagnosis not present

## 2016-10-30 DIAGNOSIS — M542 Cervicalgia: Secondary | ICD-10-CM | POA: Diagnosis not present

## 2016-10-30 DIAGNOSIS — G8929 Other chronic pain: Secondary | ICD-10-CM | POA: Diagnosis not present

## 2016-10-30 DIAGNOSIS — M79672 Pain in left foot: Secondary | ICD-10-CM

## 2016-10-30 DIAGNOSIS — M25562 Pain in left knee: Secondary | ICD-10-CM

## 2016-10-30 NOTE — Progress Notes (Signed)
Office Visit Note   Patient: Jessica Vaughn           Date of Birth: 1965-07-24           MRN: [id number] Visit Date: 10/30/2016              Requested by: Mechele Claude, MD [address] [place], [place] [address] PCP: Mechele Claude, MD   Assessment & Plan: Visit Diagnoses:  1. Chronic left-sided low back pain with left-sided sciatica   2. Left foot pain   3. Cervicalgia   4. Left knee pain, unspecified chronicity   5. Chronic midline low back pain with left-sided sciatica     Plan: We'll send approval for cervical MRI scan. She has symptoms upper extremities weakness in her legs difficulty with ambulating and with diagnosis of fatigue and for myalgias she may have some cervical cord compression or myelomalacia. After MRI scan.  Follow-Up Instructions: No Follow-up on file.   Orders:  Orders Placed This Encounter  Procedures  . XR Lumbar Spine 2-3 Views  . XR Foot Complete Left  . XR Knee 1-2 Views Left  . XR Cervical Spine 2 or 3 views   No orders of the defined types were placed in this encounter.     Procedures: No procedures performed   Clinical Data: No additional findings.   Subjective: Chief Complaint  Patient presents with  . Lower Back - Pain    HPI and is here she recently moved from last year when she was in [place]. She's had chronic back pain been diagnosed with fibromyalgia been told she has rheumatoid arthritis although she has a negative rheumatoid factor and a sedimentation rate of 3. She has chronic left knee pain difficulty reaching full extension difficulty walking she is a mature with a cane. States her legs of been weak she has numbness and tingling in her hands she uses splints for her hands at night. She has back pain that radiates down her left leg. Past history of less foot stress fractures were treated with a cam boot for extensive period of time. 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 She's been on prednisone now takes Cymbalta, Relpax, Naprosyn, previously on Deltasone, inhalers, baclofen, Imitrex, methotrexate, Lasix among other medications as well as Lyrica. Patient is been to [facility] for rheumatology treatment. She had a [facility]  MRI scan that showed some degeneration at T 12 L1 with the rest of the lumbar disc below from L1 all the way down to S1 being normal without compression. This reveals report was reviewed with her.  Review of Systems  Constitutional: Negative for chills and diaphoresis.  HENT: Negative for ear discharge, ear pain and nosebleeds.   Eyes: Negative for discharge and visual disturbance.  Respiratory: Negative for cough, choking and shortness of breath.   Cardiovascular: Negative for chest pain and palpitations.  Gastrointestinal: Negative for abdominal distention and abdominal pain.  Endocrine: Negative for cold intolerance and heat intolerance.  Genitourinary: Negative for flank pain and hematuria.  Musculoskeletal: Positive for back pain and myalgias.       A shunt reports history of fatigue and fibromyalgia.  Skin: Negative for rash and wound.  Neurological: Negative for seizures and speech difficulty.  Hematological: Negative for adenopathy. Does not bruise/bleed easily.  Psychiatric/Behavioral: Negative for agitation and suicidal ideas.     Objective: Vital Signs: Ht 5'  7" (1.702 m)   Wt 212 lb (96.2 kg)   BMI 33.20 kg/m   Physical Exam  Constitutional: She is oriented to person, place, and time. She appears well-developed.  HENT:  Head: Normocephalic.  Right Ear: External ear normal.  Left Ear: External ear normal.  Eyes: Pupils are equal, round, and reactive to light.  Neck: No tracheal deviation present. No thyromegaly present.  Cardiovascular: Normal rate.   Pulmonary/Chest: Effort normal.  Abdominal: Soft.  Musculoskeletal:  She has normal hip range of motion she has difficulty reaching  full extension with both knees lacking 10 she can passively be extended this pain with extension with crepitus in her knees. No patellar subluxation. No swelling lower extremities. Distal pulses are intact. She cannot toe walk when she tries to heel walk on the right side she is unable. Anterior tib is active with giving way weakness with manual testing with gastrocsoleus simultaneous contracture. No calf atrophy. No rash or exposed skin. She has brachial plexus tenderness both right and left upper sternal reflexes are 2+ and symmetrical. No lower extremity clonus. Knee jerks are 2+ and symmetrical.  Neurological: She is alert and oriented to person, place, and time.  Skin: Skin is warm and dry.  Psychiatric: She has a normal mood and affect. Her behavior is normal.    Ortho Exam  Specialty Comments:  No specialty comments available.  Imaging: Xr Foot Complete Left  Result Date: 10/30/2016 Three-view x-rays left foot obtained with past history of stress fracture. Bony anatomy is normal. Normal joint spaces. Negative for evidence of previous injury. Impression: Normal left foot x-rays  Xr Knee 1-2 Views Left  Result Date: 10/30/2016 Standing AP both knees lateral left knee obtained and reviewed. This shows the tiny medial osteophyte joint space is maintained negative for acute changes. Patellofemoral joint is normal. Impression: Normal left knee x-rays.  Xr Cervical Spine 2 Or 3 Views  Result Date: 10/30/2016 AP lateral C-spine x-rays are obtained and reviewed this shows loss of normal cervical lordosis with the straight cervical spine no anterolisthesis disc space height are maintained minimal uncovertebral joint changes. There is slight anterior spurring at C5-6 Impression: Straightening of the cervical spine with loss of normal curvature negative for fracture or subluxation.  Xr Lumbar Spine 2-3 Views  Result Date: 10/30/2016 AP lateral lumbar spine x-rays obtained and reviewed. This  shows well-maintained disc space height normal curvature SI joint and hip joints are normal. Impression normal lumbar spine x-rays    PMFS History: Patient Active Problem List   Diagnosis Date Noted  . Cervicalgia 10/30/2016  . Left foot pain 10/30/2016  . Left knee pain 10/30/2016  . Constipation 08/13/2016  . Hemorrhoids 08/13/2016  . Fibromyalgia 08/10/2016  . Chronic midline low back pain with left-sided sciatica 08/10/2016  . Migraine without status migrainosus, not intractable 08/10/2016   Past Medical History:  Diagnosis Date  . Anxiety   . Arthritis   . COPD (chronic obstructive pulmonary disease) (HCC)   . Depression   . Neuromuscular disorder (HCC)   . Osteoporosis   . Seizures (HCC)   . Sleep apnea   . Ulcer     Family History  Problem Relation Age of Onset  . Depression Mother   . Cancer Father   . Diabetes Father   . Hypertension Father   . Drug abuse Sister   . Colon cancer Neg Hx     Past Surgical History:  Procedure Laterality Date  . ROTATOR CUFF REPAIR Left  2009   Social History   Occupational History  . Not on file.   Social History Main Topics  . Smoking status: Current Every Day Smoker    Packs/day: 0.50    Types: Cigarettes  . Smokeless tobacco: Never Used  . Alcohol use No  . Drug use: No  . Sexual activity: Yes    Birth control/ protection: Post-menopausal

## 2016-11-04 ENCOUNTER — Encounter: Payer: Self-pay | Admitting: Nurse Practitioner

## 2016-11-06 ENCOUNTER — Encounter: Payer: Self-pay | Admitting: Family Medicine

## 2016-11-06 ENCOUNTER — Ambulatory Visit: Payer: Self-pay | Admitting: Family Medicine

## 2016-11-06 ENCOUNTER — Ambulatory Visit (INDEPENDENT_AMBULATORY_CARE_PROVIDER_SITE_OTHER): Payer: Medicaid Other | Admitting: Family Medicine

## 2016-11-06 VITALS — BP 99/67 | HR 104 | Temp 98.8°F | Ht 67.0 in | Wt 215.8 lb

## 2016-11-06 DIAGNOSIS — E669 Obesity, unspecified: Secondary | ICD-10-CM | POA: Insufficient documentation

## 2016-11-06 DIAGNOSIS — M542 Cervicalgia: Secondary | ICD-10-CM | POA: Diagnosis not present

## 2016-11-06 DIAGNOSIS — R7303 Prediabetes: Secondary | ICD-10-CM | POA: Diagnosis not present

## 2016-11-06 LAB — BAYER DCA HB A1C WAIVED: HB A1C (BAYER DCA - WAIVED): 5.5 % (ref <7.0)

## 2016-11-06 MED ORDER — METFORMIN HCL 500 MG PO TABS: 500.0000 mg | ORAL_TABLET | Freq: Two times a day (BID) | ORAL | 1 refills | Status: AC

## 2016-11-06 MED ORDER — METHOCARBAMOL 500 MG PO TABS: 500.0000 mg | ORAL_TABLET | Freq: Three times a day (TID) | ORAL | 1 refills | Status: AC | PRN

## 2016-11-06 NOTE — Patient Instructions (Signed)
Great to see you!  We will be glad to help with the referral  Take 40 mg of prednisone once daily for 5-7 days.

## 2016-11-06 NOTE — Progress Notes (Signed)
   HPI  Patient presents today here with neck pain and low blood sugars.  Patient explains she's had about one week of worsening neck pain. Patient has had an MRI recently scheduled, this is coming up in 13 days. She sees orthopedic surgery for this but the pain is much worse than usual.   Prediabetes Patient reports sporadic low blood sugars first thing in the morning. Has been as low as 60s. No medications Normal diet. Random blood sugars throughout with they have been as high as 220s. Whenever she gets to the 60s she has dizzy episodes.  Denies any radiation down the arms. Patient also has RA, she does see rheumatology  PMH: Smoking status noted ROS: Per HPI  Objective: BP 99/67   Pulse (!) 104   Temp 98.8 F (37.1 C) (Oral)   Ht '5\' 7"'$  (1.702 m)   Wt 215 lb 12.8 oz (97.9 kg)   BMI 33.80 kg/m  Gen: NAD, alert, cooperative with exam HEENT: NCAT, EOMI, PERRL CV: RRR, good S1/S2, no murmur Resp: CTABL, no wheezes, non-labored Ext: No edema, warm Neuro: Alert and oriented, No gross deficits Msk:  TTP of BL paraspinal muscles in Neck, ROM limited to 45 degrees BL and upwards and downwards   Assessment and plan:  # Neck pain Patient has prednisone at home, recommended 40 mg daily 5-7 days Also explained that this will cause elevated blood sugars. Patient using tizanidine without improvement, trial of methocarbamol  # Prediabetes Only associated with blood sugars as low as the 60s. This is difficult to explain, however it could be evidence of more uncontrolled blood sugars at baseline, that the patient is symptomatic in the 60s. Start metformin A1c today  Obesity Discussed, pt would like bariatric surgery] Followed up on referral and she has made a call arranging seminar while in clinic today.    Orders Placed This Encounter  Procedures  . Lipid panel  . CMP14+EGFR  . Bayer DCA Hb A1c Waived    Meds ordered this encounter  Medications  . metFORMIN  (GLUCOPHAGE) 500 MG tablet    Sig: Take 1 tablet (500 mg total) by mouth 2 (two) times daily with a meal.    Dispense:  60 tablet    Refill:  1  . methocarbamol (ROBAXIN) 500 MG tablet    Sig: Take 1 tablet (500 mg total) by mouth every 8 (eight) hours as needed for muscle spasms.    Dispense:  30 tablet    Refill:  Geronimo, MD [facility] 11/06/2016, 10:29 AM

## 2016-11-07 ENCOUNTER — Encounter: Payer: Self-pay | Admitting: Family Medicine

## 2016-11-07 ENCOUNTER — Encounter (INDEPENDENT_AMBULATORY_CARE_PROVIDER_SITE_OTHER): Payer: Self-pay | Admitting: *Deleted

## 2016-11-07 ENCOUNTER — Ambulatory Visit: Payer: Medicaid Other | Admitting: Nurse Practitioner

## 2016-11-07 LAB — CMP14+EGFR
A/G RATIO: 2 (ref 1.2–2.2)
ALT: 34 IU/L — AB (ref 0–32)
AST: 24 IU/L (ref 0–40)
Albumin: 4.5 g/dL (ref 3.5–5.5)
Alkaline Phosphatase: 126 IU/L — ABNORMAL HIGH (ref 39–117)
BUN/Creatinine Ratio: 17 (ref 9–23)
BUN: 12 mg/dL (ref 6–24)
Bilirubin Total: 0.2 mg/dL (ref 0.0–1.2)
CALCIUM: 9.3 mg/dL (ref 8.7–10.2)
CO2: 21 mmol/L (ref 20–29)
Chloride: 100 mmol/L (ref 96–106)
Creatinine, Ser: 0.71 mg/dL (ref 0.57–1.00)
GFR calc Af Amer: 115 mL/min/{1.73_m2} (ref >59)
GFR, EST NON AFRICAN AMERICAN: 100 mL/min/{1.73_m2} (ref >59)
GLUCOSE: 97 mg/dL (ref 65–99)
Globulin, Total: 2.2 g/dL (ref 1.5–4.5)
POTASSIUM: 4.4 mmol/L (ref 3.5–5.2)
Sodium: 139 mmol/L (ref 134–144)
TOTAL PROTEIN: 6.7 g/dL (ref 6.0–8.5)

## 2016-11-07 LAB — LIPID PANEL
CHOL/HDL RATIO: 4.6 ratio — AB (ref 0.0–4.4)
Cholesterol, Total: 238 mg/dL — ABNORMAL HIGH (ref 100–199)
HDL: 52 mg/dL (ref >39)
LDL CALC: 157 mg/dL — AB (ref 0–99)
Triglycerides: 144 mg/dL (ref 0–149)
VLDL Cholesterol Cal: 29 mg/dL (ref 5–40)

## 2016-11-08 ENCOUNTER — Other Ambulatory Visit: Payer: Self-pay | Admitting: Family Medicine

## 2016-11-08 MED ORDER — ATORVASTATIN CALCIUM 40 MG PO TABS: 40.0000 mg | ORAL_TABLET | Freq: Every day | ORAL | 3 refills | Status: AC

## 2016-11-08 NOTE — Progress Notes (Signed)
Patient aware Lipitor has been sent to pharmacy

## 2016-11-12 ENCOUNTER — Other Ambulatory Visit: Payer: Medicaid Other

## 2016-11-13 ENCOUNTER — Ambulatory Visit (INDEPENDENT_AMBULATORY_CARE_PROVIDER_SITE_OTHER): Payer: Medicaid Other | Admitting: Family Medicine

## 2016-11-13 ENCOUNTER — Encounter: Payer: Self-pay | Admitting: Family Medicine

## 2016-11-13 VITALS — BP 113/80 | HR 116 | Temp 98.1°F | Ht 67.0 in | Wt 212.0 lb

## 2016-11-13 DIAGNOSIS — R413 Other amnesia: Secondary | ICD-10-CM | POA: Diagnosis not present

## 2016-11-13 NOTE — Progress Notes (Signed)
   HPI  Patient presents today here with memory loss.  Patient states that she's had very big disturbance in her everyday life for memory loss over the last 4 months.  She states that she routinely enters room and forgets what she was looking for. At times she forgets to eat iand her son reminds her that she has not eaten in one or 2 days. She is able to prepare food, management money, move around her house, they've herself.  She has seen neurology, however not for memory loss.   Pt also [place] disability, after record review she has had this paperwork reviewed and filled out by her previous PCP on 06/15/2016.   PMH: Smoking status noted ROS: Per HPI  Objective: BP 113/80   Pulse (!) 116   Temp 98.1 F (36.7 C) (Oral)   Ht 5\' 7"  (1.702 m)   Wt 212 lb (96.2 kg)   BMI 33.20 kg/m  Gen: NAD, alert, cooperative with exam HEENT: NCAT, EOMI, PERRL CV: RRR, good S1/S2, no murmur Resp: CTABL, no wheezes, non-labored Ext: No edema, warm Neuro: Alert and oriented, 5/5 and sensation intact in all 4 extremities, patient reports paresthesia on the left to sensation testing, cranial nerves 2 through 12 intact  Assessment and plan:  # Memory loss Unclear etiology, patient is very young for such a severe finding on MMSE Refer to neurology, appreciate their recommendations and evaluation MMSE score was 15/30 today, however I'm not confident that this was an accurate per Trail of her actual ability. Labs No additional medications needed at this time in my opinion, however I do believe a neurology evaluation would be helpful.   I have declined her request to fill out disability paperwork today. My reasoning is that she had the same paper filled out 5 months ago by another provider at our practice, I have not seen another functional assessment to say that she isn't any different condition today.    Orders Placed This Encounter  Procedures  . TSH  . RPR  . HIV  antibody (with reflex)  . Ambulatory referral to Neurology    Referral Priority:   Routine    Referral Type:   Consultation    Referral Reason:   Specialty Services Required    Referred to Provider:   , DO    Requested Specialty:   Neurology    Number of Visits Requested:   1    Drema Dallas, MD [facility] 11/13/2016, 2:17 PM

## 2016-11-13 NOTE — Patient Instructions (Signed)
Great to see you!  Please call [facility] for an appointment to discuss your memory loss, our findings today will be able to be seen by them.

## 2016-11-14 ENCOUNTER — Other Ambulatory Visit: Payer: Self-pay | Admitting: *Deleted

## 2016-11-14 LAB — RPR: RPR Ser Ql: NONREACTIVE

## 2016-11-14 LAB — TSH: TSH: 1.74 u[IU]/mL (ref 0.450–4.500)

## 2016-11-14 LAB — HIV ANTIBODY (ROUTINE TESTING W REFLEX): HIV SCREEN 4TH GENERATION: NONREACTIVE

## 2016-11-14 MED ORDER — PREGABALIN 75 MG PO CAPS: 75.0000 mg | ORAL_CAPSULE | Freq: Three times a day (TID) | ORAL | 1 refills | Status: DC

## 2016-11-14 NOTE — Telephone Encounter (Signed)
Rx confirmed on [place] controlled substance database

## 2016-11-15 NOTE — Telephone Encounter (Signed)
Rx phoned in per patient request.

## 2016-11-19 ENCOUNTER — Ambulatory Visit
Admission: RE | Admit: 2016-11-19 | Discharge: 2016-11-19 | Disposition: A | Discharge status: Home / Self Care | Payer: Medicaid Other | Source: Ambulatory Visit | Attending: Orthopaedic Surgery | Admitting: Orthopaedic Surgery

## 2016-11-19 DIAGNOSIS — M542 Cervicalgia: Secondary | ICD-10-CM

## 2016-11-21 ENCOUNTER — Encounter: Payer: Self-pay | Admitting: Family Medicine

## 2016-11-21 ENCOUNTER — Encounter: Payer: Self-pay | Admitting: *Deleted

## 2016-11-26 ENCOUNTER — Telehealth: Payer: Self-pay | Admitting: Neurology

## 2016-11-26 NOTE — Telephone Encounter (Signed)
Patient is in a lot pain and was to see Dr Everlena Cooper on 11-27-16 and had to get moved to 12-18-16 please call her she wants to know what she can do about all of the pain

## 2016-11-26 NOTE — Telephone Encounter (Signed)
Spoke w/Pt to determine exactly what and where her pain is. Pt states her neck is extremely painful, unable to turn it either direction,  muscle relaxers are not effective. Advised Pt she should call Dr Ophelia Charter, her Ortho

## 2016-11-27 ENCOUNTER — Ambulatory Visit: Payer: Medicaid Other | Admitting: Neurology

## 2016-11-30 ENCOUNTER — Telehealth (INDEPENDENT_AMBULATORY_CARE_PROVIDER_SITE_OTHER): Payer: Self-pay | Admitting: Orthopaedic Surgery

## 2016-11-30 NOTE — Telephone Encounter (Signed)
Patient called advised [facility] need a note faxed to them stating patient need to see provider and appointment can not be rescheduled. Patient is seeing doctor to review MRI. Patient said it need to be faxed today. The fax # to [facility] is [phone]. The number to contact patient is [phone]

## 2016-11-30 NOTE — Telephone Encounter (Signed)
Note written and faxed 

## 2016-12-04 ENCOUNTER — Encounter (INDEPENDENT_AMBULATORY_CARE_PROVIDER_SITE_OTHER): Payer: Self-pay | Admitting: Orthopaedic Surgery

## 2016-12-04 ENCOUNTER — Ambulatory Visit (INDEPENDENT_AMBULATORY_CARE_PROVIDER_SITE_OTHER): Payer: Medicaid Other | Admitting: Orthopaedic Surgery

## 2016-12-04 VITALS — Ht 67.0 in | Wt 216.0 lb

## 2016-12-04 DIAGNOSIS — M542 Cervicalgia: Secondary | ICD-10-CM

## 2016-12-04 NOTE — Progress Notes (Signed)
Office Visit Note   Patient: Jessica Vaughn           Date of Birth: May 19, 1965           MRN: [id number] Visit Date: 12/04/2016              Requested by: Elenora Gamma, MD [address] [place], [place] [address] PCP: Elenora Gamma, MD   Assessment & Plan: Visit Diagnoses:  1. Neck pain     Plan: We reviewed the MRI scanning and I gave her a copy of the report. She has some facet arthropathy in the right C2-3. No areas of central stenosis no cord changes. We discussed working on an exercise program possibly starting some pool exercising to help with her Route back pain, fatigue, chronic neck pain and the per report of diagnosis of fibromyalgia. I discussed with her that the I do not see any areas that need surgical intervention. She states she's been referred for bariatric evaluation she can return on a when necessary basis. I discussed her she needs to get into a pool where she can exercise 5-6 times a week to help with her weight gain and do activities that should not bother lower back. I stressed the importance of her taking a role in improving her overall health. She'll look and options for pool access close to where she lives.  Follow-Up Instructions: Return if symptoms worsen or fail to improve.   Orders:  No orders of the defined types were placed in this encounter.  No orders of the defined types were placed in this encounter.     Procedures: No procedures performed   Clinical Data: No additional findings.   Subjective: Chief Complaint  Patient presents with  . Neck - Pain, Follow-up    HPI 51 year old female returns ongoing problems with pain in her neck with stiffness. She has chronic back pain. She reports diagnosis of fibromyalgia chronic fatigue. She states she's been referred for possible bariatric surgery by her primary care physician. She states she's tried ibuprofen and Naprosyn and states neither of them effective. She is also the muscle  relaxants. She is using a heating pad.  Review of Systems via systems is updated from 10/30/2016 and is unchanged.   Objective: Vital Signs: Ht 5\' 7"  (1.702 m)   Wt 216 lb (98 kg)   BMI 33.83 kg/m   Physical Exam  Constitutional: She is oriented to person, place, and time. She appears well-developed.  HENT:  Head: Normocephalic.  Right Ear: External ear normal.  Left Ear: External ear normal.  Eyes: Pupils are equal, round, and reactive to light.  Neck: No tracheal deviation present. No thyromegaly present.  Cardiovascular: Normal rate.   Pulmonary/Chest: Effort normal.  Abdominal: Soft.  Musculoskeletal:  Patient is 50% cervical rotation with complaints of pain. Upper 70 reflexes are 2+ and symmetrical no isolated motor weakness normal heel toe gait. Patient is a mature with a cane.  Neurological: She is alert and oriented to person, place, and time.  Skin: Skin is warm and dry.  Psychiatric: She has a normal mood and affect. Her behavior is normal.    Ortho Exam no ulnar nerve compression the elbow. No thenar/ hyperthenar atrophy.  Specialty Comments:  No specialty comments available.  Imaging: Study Result   CLINICAL DATA:  Neck pain.  EXAM: MRI CERVICAL SPINE WITHOUT CONTRAST  TECHNIQUE: Multiplanar, multisequence MR imaging of the cervical spine was performed. No intravenous contrast was administered.  COMPARISON:  Cervical spine x-rays dated October 30, 2016.  FINDINGS: Alignment: Physiologic.  Vertebrae: No fracture, evidence of discitis, or bone lesion.  Cord: Normal signal and morphology.  Posterior Fossa, vertebral arteries, paraspinal tissues: Negative.  Disc levels:  C2-C3: Severe right facet uncovertebral hypertrophy resulting in moderate right neuroforaminal stenosis. No spinal canal or left neuroforaminal stenosis.  C3-C4: Posterior disc osteophyte complex and bilateral facet uncovertebral hypertrophy resulting in mild bilateral  neuroforaminal stenosis. No spinal canal stenosis.  C4-C5: Mild bilateral facet uncovertebral hypertrophy without spinal canal or neuroforaminal stenosis.  C5-C6: Mild left facet uncovertebral hypertrophy without spinal canal or neuroforaminal stenosis.  C6-C7:  Normal.  C7-T1:  Normal.  IMPRESSION: 1. Multilevel degenerative changes of the cervical spine as described above, with moderate right neuroforaminal stenosis at C2-C3 due to severe facet uncovertebral hypertrophy. 2. Additional mild bilateral neuroforaminal stenosis at C3-C4. No high-grade spinal canal stenosis at any level.   Electronically Signed   By: Obie Dredge M.D.   On: 11/19/2016 16:34       PMFS History: Patient Active Problem List   Diagnosis Date Noted  . Pre-diabetes 11/06/2016  . Obesity (BMI 30.0-34.9) 11/06/2016  . Cervicalgia 10/30/2016  . Left foot pain 10/30/2016  . Left knee pain 10/30/2016  . Constipation 08/13/2016  . Hemorrhoids 08/13/2016  . Fibromyalgia 08/10/2016  . Chronic midline low back pain with left-sided sciatica 08/10/2016  . Migraine without status migrainosus, not intractable 08/10/2016   Past Medical History:  Diagnosis Date  . Anxiety   . Arthritis   . COPD (chronic obstructive pulmonary disease) (HCC)   . Depression   . Neuromuscular disorder (HCC)   . Osteoporosis   . Seizures (HCC)   . Sleep apnea   . Ulcer     Family History  Problem Relation Age of Onset  . Depression Mother   . Cancer Father   . Diabetes Father   . Hypertension Father   . Drug abuse Sister   . Colon cancer Neg Hx     Past Surgical History:  Procedure Laterality Date  . ROTATOR CUFF REPAIR Left 2009   Social History   Occupational History  . Not on file.   Social History Main Topics  . Smoking status: Current Every Day Smoker    Packs/day: 0.50    Types: Cigarettes  . Smokeless tobacco: Never Used  . Alcohol use No  . Drug use: No  . Sexual activity: Yes     Birth control/ protection: Post-menopausal

## 2016-12-17 ENCOUNTER — Other Ambulatory Visit: Payer: Self-pay | Admitting: Family Medicine

## 2016-12-17 NOTE — Telephone Encounter (Signed)
LM for pt to call about the referral to Rheumatology.

## 2016-12-18 ENCOUNTER — Ambulatory Visit: Payer: Medicaid Other | Admitting: Neurology

## 2016-12-26 ENCOUNTER — Encounter: Payer: Self-pay | Admitting: Neurology

## 2017-01-01 ENCOUNTER — Encounter: Payer: Self-pay | Admitting: Family Medicine

## 2017-01-03 ENCOUNTER — Encounter: Payer: Self-pay | Admitting: *Deleted

## 2017-01-03 ENCOUNTER — Telehealth: Payer: Self-pay | Admitting: *Deleted

## 2017-01-03 NOTE — Telephone Encounter (Signed)
Telephone call from patient who is upset because we will not fill out a form from [facility] of Social Services. We received a telephone call from [facility] at Department of Social Services yesterday stating that she received some paper work from Ms.Yeates and she was questioning if it was one of our providers signature. Every provider was questioned and shown the signature that was on the form and it was determined that it was not a signature from any provider in our office. I spoke with Dr. Ermalinda Memos further regarding this incident and the decision was made to dismiss the patient from our office for falsifying this document. This was explained in detail to the patient and she voices understanding but says she did not sign anything and wants a copy of the form that she gave to Department of Social Services. I told her I would mail her a copy today.

## 2017-01-07 ENCOUNTER — Ambulatory Visit: Payer: Medicaid Other | Admitting: Neurology

## 2017-01-18 ENCOUNTER — Other Ambulatory Visit: Payer: Self-pay | Admitting: Family Medicine

## 2017-01-21 ENCOUNTER — Ambulatory Visit (INDEPENDENT_AMBULATORY_CARE_PROVIDER_SITE_OTHER): Payer: Medicaid Other | Admitting: Neurology

## 2017-01-21 ENCOUNTER — Encounter: Payer: Self-pay | Admitting: Neurology

## 2017-01-21 VITALS — BP 102/74 | HR 95 | Ht 67.0 in | Wt 219.6 lb

## 2017-01-21 DIAGNOSIS — F17210 Nicotine dependence, cigarettes, uncomplicated: Secondary | ICD-10-CM

## 2017-01-21 DIAGNOSIS — H4710 Unspecified papilledema: Secondary | ICD-10-CM | POA: Diagnosis not present

## 2017-01-21 DIAGNOSIS — G43709 Chronic migraine without aura, not intractable, without status migrainosus: Secondary | ICD-10-CM | POA: Diagnosis not present

## 2017-01-21 DIAGNOSIS — R413 Other amnesia: Secondary | ICD-10-CM

## 2017-01-21 NOTE — Progress Notes (Signed)
NEUROLOGY FOLLOW UP OFFICE NOTE  Jessica Vaughn [id number]  HISTORY OF PRESENT ILLNESS: Jessica Vaughn is a 51 year old left-handed female with asthma, hyperlipidemia, fibromyalgia, rheumatoid arthritis, and remote history of childhood seizures who follows up for chronic migraine as well as new issue, memory deficits.  UPDATE: Migraines: Unchanged. Current NSAIDS:  naproxen 500mg  (with sumatriptan 100mg ) Current analgesics:  no Current triptans:  Sumatriptan 100mg  with naproxen 500mg  (Relpax 40mg  unable to obtain authorization) Current anti-emetic:  promethazine 25mg  Current muscle relaxants:  no Current anti-anxiolytic:  no Current sleep aide:  no Current Antihypertensive medications:  propranolol ER 60mg , furosemide Current Antidepressant medications:  Cymbalta 60mg  twice daily Current Anticonvulsant medications:   Lyrica 75mg  twice daily Current Vitamins/Herbal/Supplements:  Mg, CoQ10, B12, D, folic acid Current Antihistamines/Decongestants:  no Other therapy:  Ice packs Other medication:  Methotrexate  She also reports dimming of vision and tinnitus.  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  Caffeine:  1/2 cup coffee daily Alcohol:  no Smoker:  sometimes Diet:  Hydrates.  Salads.  No fried foods or sweets. Exercise:  5 miles a week (limited due to pain) Depression/anxiety:  yes Sleep hygiene:  Poor.  Pain keeps her up at night.  Memory deficits: She notes short-term memory problems for about a year.  She often enters a room and forgets what she is looking for.  She often forgets to eat meals and needs to be reminded.  She often forgets to take her medication. She is able to perform complex ADLs such as managing finances and driving.  She is concerned because Alzheimer's dementia runs in her family.  Her father was diagnosed with Alzheimer's at age 29 and he passed away at 14.  Her mother passed away at  89 but she told her that she had early stages of Alzheimer's.  Her paternal grandfather had Alzheimer's.  11/13/16 Labs:  HIV negative, TSH 1.740, RPR nonreactive 06/15/16 Labs:  B12 over 2000, folate over 20.  HISTORY: Onset:  Since 51 to 51 years old, but worse over past year. Location:  Bi-frontal and back of neck into shoulders. Quality:  throbbing Intensity:  10/10 Aura:  no Prodrome:  no Postdrome:  no Associated symptoms:  Nausea, photophobia, phonophobia, osmophobia, sometimes nausea.  She has not had any new worse headache of her life or thunderclap headache Duration:  Wakes up every morning with headache, lasting several hours till evening. Frequency:  daily Frequency of abortive medication: daily Triggers/exacerbating factors:  coughing Relieving factors:  Ice packs Activity:  aggravates   Past NSAIDS:  meloxicam, Ibuprofen 800mg , Aleve Past analgesics:  tramadol (for pain), Excedrin, Tylenol Past abortive triptans:  sumatriptan 50mg  Past muscle relaxants:  Baclofen, Flexeril, tizanidine (all muscle relaxants cause drowsiness) Past anti-emetic:  Zofran Past antihypertensive medications:  no Past antidepressant medications:  sertraline 100mg  (for depression) Past anticonvulsant medications: topiramate 100mg  Past vitamins/Herbal/Supplements:  no Past antihistamines/decongestants:  no    Family history of headache:  Unknown.    PAST MEDICAL HISTORY: Past Medical History:  Diagnosis Date  . Anxiety   . Arthritis   . COPD (chronic obstructive pulmonary disease) (HCC)   . Depression   . Neuromuscular disorder (HCC)   . Osteoporosis   . Seizures (HCC)   . Sleep apnea   . Ulcer     MEDICATIONS: Current Outpatient Prescriptions on File Prior to Visit  Medication Sig Dispense Refill  . albuterol (PROVENTIL  HFA;VENTOLIN HFA) 108 (90 Base) MCG/ACT inhaler Inhale 1 puff into the lungs every 6 (six) hours as needed for wheezing or shortness of breath. 1 Inhaler 6  .  atorvastatin (LIPITOR) 40 MG tablet Take 1 tablet (40 mg total) by mouth daily. 90 tablet 3  . baclofen (LIORESAL) 10 MG tablet Take 10 mg by mouth 3 (three) times daily.    . cholecalciferol (VITAMIN D) 1000 units tablet Take 1 tablet (1,000 Units total) by mouth daily. 30 tablet 5  . DULoxetine (CYMBALTA) 60 MG capsule Take 60 mg by mouth 2 (two) times daily.    Marland Kitchen eletriptan (RELPAX) 40 MG tablet Take 1 tablet earliest onset of migraine.  May repeat once in 2 hours if headache persists or recurs. 10 tablet 2  . Fluticasone-Salmeterol (ADVAIR) 250-50 MCG/DOSE AEPB Inhale 1 puff into the lungs 2 (two) times daily. 60 each 5  . folic acid (FOLVITE) 1 MG tablet Take 1 tablet (1 mg total) by mouth daily. 30 tablet 5  . furosemide (LASIX) 40 MG tablet Take 1 tablet (40 mg total) by mouth daily. 30 tablet 5  . hydrocortisone (ANUSOL-HC) 2.5 % rectal cream Place 1 application rectally 2 (two) times daily. 30 g 1  . ibuprofen (ADVIL,MOTRIN) 800 MG tablet TAKE 1 TABLET BY MOUTH EVERY 8 HOURS AS NEEDED 90 tablet 2  . lubiprostone (AMITIZA) 24 MCG capsule Take 1 capsule (24 mcg total) by mouth 2 (two) times daily with a meal. 60 capsule 5  . metFORMIN (GLUCOPHAGE) 500 MG tablet Take 1 tablet (500 mg total) by mouth 2 (two) times daily with a meal. 60 tablet 1  . methocarbamol (ROBAXIN) 500 MG tablet Take 1 tablet (500 mg total) by mouth every 8 (eight) hours as needed for muscle spasms. 30 tablet 1  . methotrexate 2.5 MG tablet TAKE 8 TABLETS BY MOUTH ONCE A WEEK 32 tablet 5  . naproxen (NAPROSYN) 500 MG tablet Take one with every sumatriptan dose 60 tablet 1  . pantoprazole (PROTONIX) 40 MG tablet TAKE 1 TABLET BY MOUTH ONCE DAILY 30 tablet 5  . polyethylene glycol-electrolytes (TRILYTE) 420 g solution Take 4,000 mLs by mouth as directed. 4000 mL 0  . pregabalin (LYRICA) 75 MG capsule Take 1 capsule (75 mg total) by mouth 3 (three) times daily. 90 capsule 1  . promethazine (PHENERGAN) 25 MG tablet Take 1  tablet (25 mg total) by mouth every 6 (six) hours as needed for nausea or vomiting. 30 tablet 0  . propranolol ER (INDERAL LA) 60 MG 24 hr capsule Take 1 capsule (60 mg total) by mouth daily. 30 capsule 2  . SUMAtriptan (IMITREX) 100 MG tablet Take 1 tablet (100 mg total) by mouth once as needed for migraine. May repeat in 2 hours if headache persists or recurs. 10 tablet 2  . vitamin B-12 (CYANOCOBALAMIN) 1000 MCG tablet Take 500 mcg by mouth daily.    . Vitamin D, Ergocalciferol, (DRISDOL) 50000 units CAPS capsule TAKE 1 CAPSULE BY MOUTH TWICE A WEEK 16 capsule 0   No current facility-administered medications on file prior to visit.     ALLERGIES: Allergies  Allergen Reactions  . Robaxin [Methocarbamol]   . Zanaflex [Tizanidine Hcl]     FAMILY HISTORY: Family History  Problem Relation Age of Onset  . Depression Mother   . Cancer Father   . Diabetes Father   . Hypertension Father   . Drug abuse Sister   . Colon cancer Neg Hx  SOCIAL HISTORY: Social History   Social History  . Marital status: Divorced    Spouse name: N/A  . Number of children: 2  . Years of education: N/A   Occupational History  . Not on file.   Social History Main Topics  . Smoking status: Current Every Day Smoker    Packs/day: 0.50    Types: Cigarettes  . Smokeless tobacco: Never Used  . Alcohol use No  . Drug use: No  . Sexual activity: Yes    Birth control/ protection: Post-menopausal   Other Topics Concern  . Not on file   Social History Narrative   Lives with 2 sons, 1 is disabled. Moved her recently for [place].     REVIEW OF SYSTEMS: Constitutional: No fevers, chills, or sweats, no generalized fatigue, change in appetite Eyes: No visual changes, double vision, eye pain Ear, nose and throat: No hearing loss, ear pain, nasal congestion, sore throat Cardiovascular: No chest pain, palpitations Respiratory:  No shortness of breath at rest or with exertion,  wheezes GastrointestinaI: No nausea, vomiting, diarrhea, abdominal pain, fecal incontinence Genitourinary:  No dysuria, urinary retention or frequency Musculoskeletal:  No neck pain, back pain Integumentary: No rash, pruritus, skin lesions Neurological: as above Psychiatric: No depression, insomnia, anxiety Endocrine: No palpitations, fatigue, diaphoresis, mood swings, change in appetite, change in weight, increased thirst Hematologic/Lymphatic:  No purpura, petechiae. Allergic/Immunologic: no itchy/runny eyes, nasal congestion, recent allergic reactions, rashes  PHYSICAL EXAM: Vitals:   01/21/17 1416  BP: 102/74  Pulse: 95   General: No acute distress.  Patient appears well-groomed.   Head:  Normocephalic/atraumatic Eyes:  Fundi examined but not visualized Neck: supple, no paraspinal tenderness, full range of motion Heart:  Regular rate and rhythm Lungs:  Clear to auscultation bilaterally Back: No paraspinal tenderness Neurological Exam: alert and oriented to person, place, and time. Attention span and concentration intact, recent and remote memory intact, fund of knowledge intact.  Speech fluent and not dysarthric, language intact.  CN II-XII intact. Bulk and tone normal, muscle strength 5/5 throughout.  Sensation to light touch  intact.  Deep tendon reflexes 2+ throughout.  Finger to nose testing intact.  Gait normal  IMPRESSION: 1.  Bilateral papilledema, headache.  Consider idiopathic intracranial hypertension. 2.  Memory deficits, family history of Alzheimer's dementia 3.  Chronic migraine 4.  Cigarette smoker  PLAN: 1.  MRI w/wo/MRV of head 2.  Following MRI, lumbar puncture with opening pressure and CSF analysis of cell count, gram stain/culture, protein and glucose.  Pending results of MRI and LP, start acetazolamide. 3.  Neuropsychological testing 4.  Propranolol for migraine prophylaxis. 5.  Sumatriptan with naproxen for abortive therapy.  Promethazine for nausea. 6.   Smoking cessation instruction/counseling given:  counseled patient on the dangers of tobacco use, advised patient to stop smoking, and reviewed strategies to maximize success. 7.  Follow up after testing.  25 minutes spent face to face with patient, over 50% spent discussing diagnosis and testing.  Shon Millet, DO

## 2017-01-21 NOTE — Patient Instructions (Addendum)
1.  Check MRI and MRV of head  We have sent a referral to [facility] for your MRI and they will call you directly to schedule your appt. They are located at [address] [facility] If you need to contact them directly please call [phone].   2.  After MRI, schedule for spinal tap (lumbar puncture) checking opening pressure and CSF cell count, gram stain/culture, protein and glucose 3.  Schedule for neurocognitive testing 4.  Follow up after testing.

## 2017-01-21 NOTE — Addendum Note (Signed)
Addended by: Horatio Pel on: 01/21/2017 03:10 PM   Modules accepted: Orders

## 2017-01-22 DIAGNOSIS — F32A Depression, unspecified: Secondary | ICD-10-CM | POA: Insufficient documentation

## 2017-02-28 ENCOUNTER — Telehealth: Payer: Self-pay | Admitting: Neurology

## 2017-02-28 ENCOUNTER — Other Ambulatory Visit: Payer: Self-pay

## 2017-02-28 DIAGNOSIS — R413 Other amnesia: Secondary | ICD-10-CM

## 2017-02-28 DIAGNOSIS — F17210 Nicotine dependence, cigarettes, uncomplicated: Secondary | ICD-10-CM

## 2017-02-28 DIAGNOSIS — H4710 Unspecified papilledema: Secondary | ICD-10-CM

## 2017-02-28 NOTE — Telephone Encounter (Signed)
Looked in system, orders were in but incomplete. Called Pt to verify where she wanted the tests done, she rqstd Jessica Vaughn. Called scheduling, they could see the orders but needed them put in again, did that and called scheduling again to have them contact the patient directly to schedule.

## 2017-02-28 NOTE — Telephone Encounter (Signed)
Pt  called and said Dr Everlena Cooper was referring her for a MRI and Spinal Tap and has not heard anything yet

## 2017-02-28 NOTE — Telephone Encounter (Signed)
Patient calling in regarding needing to get an MRI and Spinal Tap. Please Advise. Thanks

## 2017-03-12 ENCOUNTER — Telehealth: Payer: Self-pay | Admitting: Neurology

## 2017-03-12 NOTE — Telephone Encounter (Signed)
Pt called and said she still has not heard anything on scheduling her MRI and spinal tap

## 2017-03-19 NOTE — Telephone Encounter (Signed)
Called and talked with Shanika at central scheduling, she said she would need to coordinate with OR suite for LP, but will contact the Pt tomorrow to schedule. Called Pt, advsd her someone will contact her to schedule tomorrow from [facility]

## 2017-03-23 ENCOUNTER — Other Ambulatory Visit: Payer: Self-pay | Admitting: Family Medicine

## 2017-03-28 ENCOUNTER — Encounter (HOSPITAL_COMMUNITY): Payer: Medicaid Other

## 2017-03-28 ENCOUNTER — Ambulatory Visit (HOSPITAL_COMMUNITY): Admission: RE | Admit: 2017-03-28 | Payer: Medicaid Other | Source: Ambulatory Visit

## 2017-03-28 NOTE — Telephone Encounter (Signed)
Faith from [facility] radiology called about Pt coming in for LP today-she saw where it was noted for LP not until after MRI, MRI is 12/10-advsd her will need to r/s LP, she will have scheduling contact Pt to r/s LP after MRI

## 2017-04-01 ENCOUNTER — Other Ambulatory Visit (HOSPITAL_COMMUNITY): Payer: Medicaid Other

## 2017-04-01 ENCOUNTER — Ambulatory Visit (HOSPITAL_COMMUNITY): Payer: Medicaid Other

## 2017-04-02 ENCOUNTER — Other Ambulatory Visit: Payer: Self-pay | Admitting: Family Medicine

## 2017-04-08 ENCOUNTER — Ambulatory Visit (HOSPITAL_COMMUNITY): Admission: RE | Admit: 2017-04-08 | Payer: Medicaid Other | Source: Ambulatory Visit

## 2017-04-08 ENCOUNTER — Ambulatory Visit (HOSPITAL_COMMUNITY): Payer: Medicaid Other

## 2017-04-15 ENCOUNTER — Ambulatory Visit (HOSPITAL_COMMUNITY): Payer: Medicaid Other

## 2017-04-17 ENCOUNTER — Ambulatory Visit (INDEPENDENT_AMBULATORY_CARE_PROVIDER_SITE_OTHER): Payer: Medicaid Other | Admitting: Surgery

## 2017-04-17 ENCOUNTER — Ambulatory Visit (HOSPITAL_COMMUNITY): Payer: Medicaid Other

## 2017-04-17 ENCOUNTER — Encounter (INDEPENDENT_AMBULATORY_CARE_PROVIDER_SITE_OTHER): Payer: Self-pay | Admitting: Surgery

## 2017-04-17 ENCOUNTER — Encounter (INDEPENDENT_AMBULATORY_CARE_PROVIDER_SITE_OTHER): Payer: Self-pay | Admitting: *Deleted

## 2017-04-17 VITALS — BP 111/77 | HR 90 | Ht 67.0 in | Wt 214.0 lb

## 2017-04-17 DIAGNOSIS — M7989 Other specified soft tissue disorders: Secondary | ICD-10-CM

## 2017-04-17 DIAGNOSIS — M79662 Pain in left lower leg: Secondary | ICD-10-CM

## 2017-04-17 DIAGNOSIS — M25562 Pain in left knee: Secondary | ICD-10-CM | POA: Diagnosis not present

## 2017-04-17 DIAGNOSIS — M797 Fibromyalgia: Secondary | ICD-10-CM | POA: Diagnosis not present

## 2017-04-17 MED ORDER — METHYLPREDNISOLONE ACETATE 40 MG/ML IJ SUSP
40.0000 mg | INTRAMUSCULAR | Status: AC | PRN
  Administered 2017-04-17: 40 mg via INTRA_ARTICULAR

## 2017-04-17 MED ORDER — LIDOCAINE HCL 1 % IJ SOLN
3.0000 mL | INTRAMUSCULAR | Status: AC | PRN
  Administered 2017-04-17: 3 mL

## 2017-04-17 MED ORDER — BUPIVACAINE HCL 0.25 % IJ SOLN
6.0000 mL | INTRAMUSCULAR | Status: AC | PRN
  Administered 2017-04-17: 6 mL via INTRA_ARTICULAR

## 2017-04-17 NOTE — Progress Notes (Signed)
Office Visit Note   Patient: Jessica Vaughn           Date of Birth: 04/12/1966           MRN: [id number] Visit Date: 04/17/2017              Requested by: No referring provider defined for this encounter. PCP: Patient, No Pcp Per   Assessment & Plan: Visit Diagnoses:  1. Pain and swelling of lower leg, left   2. Left knee pain, unspecified chronicity   3. Fibromyalgia     Plan: With patient's worsening complaint of left calf pain and swelling since she began wearing a knee brace I will order stat venous Doppler to rule out DVT. We'll await callback report from vascular lab. Clinical assistant spoke with patient and she uses public transportation. Toniann Fail in our office spoke with patient's driver and attempts at contacting their office was unsuccessful. Driver stated that they cannot provide transportation without prior approval. Therefore patient is unable to get a ride to vascular lab today. I previously discussed risk of possible DVT and pulmonary embolism. Patient did have a appointment scheduled with vascular lab today at 39 and Toniann Fail rescheduled this for tomorrow. Patient stated that she can see about getting a ride from a friend. In hopes to give patient some relief of her left knee pain I did offer intra-articular injection. Blood sugar in the office 84. After patient consent left knee was prepped with Betadine and an intra-articular Marcaine/Depo-Medrol 6:1 injection performed.  Follow-Up Instructions: No Follow-up on file.   Orders:  No orders of the defined types were placed in this encounter.  No orders of the defined types were placed in this encounter.     Procedures: Large Joint Inj: L knee on 04/17/2017 10:01 AM Indications: pain and joint swelling Details: 25 G 1.5 in needle, anterolateral approach  Arthrogram: No  Medications: 3 mL lidocaine 1 %; 6 mL bupivacaine 0.25 %; 40 mg methylPREDNISolone acetate 40 MG/ML Outcome: tolerated well, no immediate  complications Consent was given by the patient. Patient was prepped and draped in the usual sterile fashion.       Clinical Data: No additional findings.   Subjective: Chief Complaint  Patient presents with  . Left Knee - Pain  . Left Leg - Pain    HPI Patient returns with complaints of left knee and lower leg pain. She was last seen in the office by Dr. Ophelia Charter for left leg issues July 2018. She continues to describe having left medial knee pain and swelling with going up and down stairs, standing, pivoting. Has tried to wear the brace the last several weeks and has had increased swelling and pain in her left calf. Pain with weightbearing. No complaints of chest pain, shortness of breath. Review of Systems   Objective: Vital Signs: BP 111/77   Pulse 90   Ht 5\' 7"  (1.702 m)   Wt 214 lb (97.1 kg)   BMI 33.52 kg/m   Physical Exam  Constitutional: She is oriented to person, place, and time. No distress.  HENT:  Head: Normocephalic and atraumatic.  Eyes: EOM are normal. Pupils are equal, round, and reactive to light.  Pulmonary/Chest: No respiratory distress.  Musculoskeletal:  Gait antalgic. Left knee swelling with small effusion. Medial greater than lateral joint line tenderness. Decreased range of motion secondary to pain. Left calf moderate to markedly tender. Does have l swelling eft calf down to her ankle.  Neurological: She is alert and  oriented to person, place, and time.  Skin: Skin is warm and dry.  Psychiatric: She has a normal mood and affect.    Ortho Exam  Specialty Comments:  No specialty comments available.  Imaging: No results found.   PMFS History: Patient Active Problem List   Diagnosis Date Noted  . Pre-diabetes 11/06/2016  . Obesity (BMI 30.0-34.9) 11/06/2016  . Cervicalgia 10/30/2016  . Left foot pain 10/30/2016  . Left knee pain 10/30/2016  . Constipation 08/13/2016  . Hemorrhoids 08/13/2016  . Fibromyalgia 08/10/2016  . Chronic midline  low back pain with left-sided sciatica 08/10/2016  . Migraine without status migrainosus, not intractable 08/10/2016   Past Medical History:  Diagnosis Date  . Anxiety   . Arthritis   . COPD (chronic obstructive pulmonary disease) (HCC)   . Depression   . Neuromuscular disorder (HCC)   . Osteoporosis   . Seizures (HCC)   . Sleep apnea   . Ulcer     Family History  Problem Relation Age of Onset  . Depression Mother   . Cancer Father   . Diabetes Father   . Hypertension Father   . Drug abuse Sister   . Colon cancer Neg Hx     Past Surgical History:  Procedure Laterality Date  . ROTATOR CUFF REPAIR Left 2009   Social History   Occupational History  . Not on file  Tobacco Use  . Smoking status: Current Every Day Smoker    Packs/day: 0.50    Types: Cigarettes  . Smokeless tobacco: Never Used  Substance and Sexual Activity  . Alcohol use: No  . Drug use: No  . Sexual activity: Yes    Birth control/protection: Post-menopausal

## 2017-04-18 ENCOUNTER — Ambulatory Visit (HOSPITAL_COMMUNITY): Payer: Medicaid Other

## 2017-04-18 ENCOUNTER — Encounter (HOSPITAL_COMMUNITY): Payer: Medicaid Other

## 2017-04-18 ENCOUNTER — Ambulatory Visit (HOSPITAL_COMMUNITY): Admission: RE | Admit: 2017-04-18 | Payer: Medicaid Other | Source: Ambulatory Visit

## 2017-05-01 ENCOUNTER — Ambulatory Visit (HOSPITAL_COMMUNITY)
Admission: RE | Admit: 2017-05-01 | Discharge: 2017-05-01 | Disposition: A | Discharge status: Home / Self Care | Payer: Medicaid Other | Source: Ambulatory Visit | Attending: Neurology | Admitting: Neurology

## 2017-05-01 DIAGNOSIS — H4710 Unspecified papilledema: Secondary | ICD-10-CM | POA: Insufficient documentation

## 2017-05-01 DIAGNOSIS — F17210 Nicotine dependence, cigarettes, uncomplicated: Secondary | ICD-10-CM | POA: Diagnosis not present

## 2017-05-01 DIAGNOSIS — R413 Other amnesia: Secondary | ICD-10-CM

## 2017-05-01 LAB — POCT I-STAT CREATININE: Creatinine, Ser: 0.7 mg/dL (ref 0.44–1.00)

## 2017-05-01 MED ORDER — GADOBENATE DIMEGLUMINE 529 MG/ML IV SOLN
20.0000 mL | Freq: Once | INTRAVENOUS | Status: AC | PRN
  Administered 2017-05-01: 20 mL via INTRAVENOUS

## 2017-05-02 ENCOUNTER — Encounter (HOSPITAL_COMMUNITY): Payer: Medicaid Other

## 2017-05-03 ENCOUNTER — Other Ambulatory Visit: Payer: Self-pay | Admitting: Neurology

## 2017-05-03 ENCOUNTER — Ambulatory Visit (HOSPITAL_COMMUNITY)
Admission: RE | Admit: 2017-05-03 | Discharge: 2017-05-03 | Disposition: A | Discharge status: Home / Self Care | Payer: Medicaid Other | Source: Ambulatory Visit | Attending: Neurology | Admitting: Neurology

## 2017-05-03 ENCOUNTER — Encounter (HOSPITAL_COMMUNITY): Payer: Self-pay

## 2017-05-03 DIAGNOSIS — H4710 Unspecified papilledema: Secondary | ICD-10-CM | POA: Diagnosis present

## 2017-05-03 LAB — CSF CELL COUNT WITH DIFFERENTIAL
RBC Count, CSF: 0 /mm3
Tube #: 4
WBC, CSF: 0 /mm3 (ref 0–5)

## 2017-05-03 LAB — PROTEIN, CSF: TOTAL PROTEIN, CSF: 32 mg/dL (ref 15–45)

## 2017-05-03 LAB — GLUCOSE, CSF: Glucose, CSF: 65 mg/dL (ref 40–70)

## 2017-05-03 MED ORDER — ACETAMINOPHEN 325 MG PO TABS
ORAL_TABLET | ORAL | Status: AC
  Filled 2017-05-03: qty 2

## 2017-05-03 MED ORDER — ACETAMINOPHEN 325 MG PO TABS
650.0000 mg | ORAL_TABLET | ORAL | Status: DC | PRN
  Administered 2017-05-03 (×2): 325 mg via ORAL

## 2017-05-03 NOTE — Progress Notes (Signed)
Patient was given discharge instructions and verbalized understanding of instructions. Patient had a little soreness to puncture area but no headache. Patient ambulated to car and was discharged to home in stable condition.

## 2017-05-03 NOTE — Progress Notes (Signed)
Patient requests "to go ahead and take the tylenol ordered to ward off a headache". "I have a history of migraines and I do better if I take a Tylenol as preventative for migraines". See MAR. Light in room dimmed for patient comfort.  Call bell within reach. Patient verbalized understanding that she may have Bathroom privileges but to remain flat as much as possible.  Lunch tray ordered from Dietary. Patient tolerating Diet Coke and Graham crackers without nausea , vomiting or headache.

## 2017-05-03 NOTE — Procedures (Signed)
Preprocedure Dx: BILATERAL papilledema, headaches Postprocedure Dx: BILATERAL papilledema, headaches Procedure:  Fluoroscopically guided lumbar puncture Radiologist:  Tyron Russell Anesthesia:  3 ml of 1% lidocaine Specimen:  12 ml CSF, clear colorless EBL:   < 1 ml Opening pressure: 6 cm H2O, measured LLD Complications: None

## 2017-05-03 NOTE — Progress Notes (Signed)
Report to Lindaann Slough, RN

## 2017-05-03 NOTE — Discharge Instructions (Signed)
PLEASE REMAIN FLAT AS MUCH AS POSSIBLE FOR THE REMAINDER OF THE DAY YOU MAY RESUME YOUR USUAL DAILY MEDICATIONS          Lumbar Puncture, Care After Refer to this sheet in the next few weeks. These instructions provide you with information on caring for yourself after your procedure. Your health care provider may also give you more specific instructions. Your treatment has been planned according to current medical practices, but problems sometimes occur. Call your health care provider if you have any problems or questions after your procedure. What can I expect after the procedure? After your procedure, it is typical to have the following sensations:  Mild discomfort or pain at the insertion site.  Mild headache that is relieved with pain medicines.  Follow these instructions at home:   Avoid lifting anything heavier than 10 lb (4.5 kg) for at least 12 hours after the procedure.  Drink enough fluids to keep your urine clear or pale yellow. Contact a health care provider if:  You have fever or chills.  You have nausea or vomiting.  You have a headache that lasts for more than 2 days. Get help right away if:  You have any numbness or tingling in your legs.  You are unable to control your bowel or bladder.  You have bleeding or swelling in your back at the insertion site.  You are dizzy or faint. This information is not intended to replace advice given to you by your health care provider. Make sure you discuss any questions you have with your health care provider. Document Released: 04/14/2013 Document Revised: 09/15/2015 Document Reviewed: 12/16/2012 Elsevier Interactive Patient Education  2017 [employer]    Dr. Ulyses Southward is radiologist that performed your Lumbar puncture, at patient request of name of MD

## 2017-05-06 LAB — CSF CULTURE W GRAM STAIN
Culture: NO GROWTH
Gram Stain: NONE SEEN

## 2017-05-06 LAB — CSF CULTURE

## 2017-05-07 ENCOUNTER — Ambulatory Visit (HOSPITAL_COMMUNITY): Payer: Medicaid Other

## 2017-05-08 ENCOUNTER — Telehealth: Payer: Self-pay

## 2017-05-08 DIAGNOSIS — H4710 Unspecified papilledema: Secondary | ICD-10-CM

## 2017-05-08 NOTE — Telephone Encounter (Signed)
Faxing referral to [facility]

## 2017-05-08 NOTE — Telephone Encounter (Signed)
-----   Message from Drema Dallas, DO sent at 05/03/2017 12:31 PM EST ----- The spinal tap showed no elevation of spinal fluid pressure.  This test and the MRI are normal, so I have no explanation for the swelling in the eyes as noted by her optometrist.  I would like to refer her to ophthalmology for their evaluation and opinion.

## 2017-05-10 ENCOUNTER — Encounter: Payer: Self-pay | Admitting: Neurology

## 2017-05-10 ENCOUNTER — Other Ambulatory Visit: Payer: Self-pay

## 2017-05-10 MED ORDER — IBUPROFEN 800 MG PO TABS: 800.0000 mg | ORAL_TABLET | Freq: Three times a day (TID) | ORAL | 3 refills | Status: AC | PRN

## 2017-05-20 ENCOUNTER — Ambulatory Visit (HOSPITAL_COMMUNITY): Payer: Medicaid Other

## 2017-06-04 ENCOUNTER — Encounter: Payer: Self-pay | Admitting: Psychology

## 2017-06-04 ENCOUNTER — Ambulatory Visit (INDEPENDENT_AMBULATORY_CARE_PROVIDER_SITE_OTHER): Payer: Medicaid Other | Admitting: Psychology

## 2017-06-04 DIAGNOSIS — F4323 Adjustment disorder with mixed anxiety and depressed mood: Secondary | ICD-10-CM

## 2017-06-04 DIAGNOSIS — R413 Other amnesia: Secondary | ICD-10-CM | POA: Diagnosis not present

## 2017-06-04 DIAGNOSIS — F4312 Post-traumatic stress disorder, chronic: Secondary | ICD-10-CM
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
## 2017-06-11 ENCOUNTER — Telehealth: Payer: Self-pay | Admitting: Neurology

## 2017-06-11 ENCOUNTER — Encounter: Payer: Self-pay | Admitting: Psychology

## 2017-06-11 ENCOUNTER — Ambulatory Visit: Payer: Medicaid Other | Admitting: Psychology

## 2017-06-11 DIAGNOSIS — R413 Other amnesia: Secondary | ICD-10-CM

## 2017-06-11 NOTE — Telephone Encounter (Signed)
Please advise Pt to contact PCP for this issue

## 2017-06-11 NOTE — Progress Notes (Signed)
   Neuropsychology Note  Terese Gibas completed 180 minutes of neuropsychological testing with technician, Wallace Keller, BS, under the supervision of Dr. Elvis Coil, Licensed Psychologist. The patient did not appear overtly distressed by the testing session, per behavioral observation or via self-report to the technician. Rest breaks were offered.   Clinical Decision Making: In considering the patient's current level of functioning, level of presumed impairment, nature of symptoms, emotional and behavioral responses during the interview, level of literacy, and observed level of motivation/effort, a battery of tests was selected and communicated to the psychometrician.  Communication between the psychologist and technician was ongoing throughout the testing session and changes were made as deemed necessary based on patient performance on testing, technician observations and additional pertinent factors such as those listed above.  Jessica Vaughn will return within approximately 2 weeks for an interactive feedback session with Dr. Alinda Dooms at which time her test performances, clinical impressions and treatment recommendations will be reviewed in detail. The patient understands she can contact our office should she require our assistance before this time.  20 minutes spent performing neuropsychological evaluation services/clinical decision making (psychologist). [CPT 96132] 180 minutes spent face-to-face with patient administering standardized tests, 60 minutes spent scoring (technician). [CPT [id number], 96139]  Full report to follow.

## 2017-06-11 NOTE — Telephone Encounter (Signed)
Patient is here today in the office to see [name] for testing. She would like to speak with you before she leaves due to having Panic attacks at night. Please Advise. Thanks
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
## 2017-06-20 ENCOUNTER — Encounter: Payer: Self-pay | Admitting: Psychology

## 2017-06-20 ENCOUNTER — Ambulatory Visit (INDEPENDENT_AMBULATORY_CARE_PROVIDER_SITE_OTHER): Payer: Medicaid Other | Admitting: Psychology

## 2017-06-20 DIAGNOSIS — F321 Major depressive disorder, single episode, moderate: Secondary | ICD-10-CM

## 2017-06-20 DIAGNOSIS — R413 Other amnesia: Secondary | ICD-10-CM | POA: Diagnosis not present

## 2017-06-20 DIAGNOSIS — F4312 Post-traumatic stress disorder, chronic: Secondary | ICD-10-CM

## 2017-06-20 NOTE — Patient Instructions (Addendum)
Based on this evaluation, I do not think you have dementia. I think it is most likely that your severe depression and anxiety, as well as your chronic pain and stress, are affecting your cognitive functioning in daily life. I am so glad that you have an appointment to see a counselor; I think counseling is going to be very helpful for you!    The effect of depression and anxiety on your cognitive functioning: . One of the typical symptoms of depression is difficulty concentrating and making decisions, and various types of anxiety also interfere with attention and concentration . Problems with attention and concentration can disrupt the process of learning and making new memories, which can make it seem like there is a problem with your memory. In your daily life, you may experience this disruption as forgetting names and appointments, misplacing items, and needing to make lists for shopping and errands. It may be harder for you to stay focused on tasks and feel as "sharp" as you did in the past.  . Also, when we are depressed or anxious, we often pay more attention to our difficulties (rather than our strengths) in our daily life, and this can make it seem to [place] like we are doing worse cognitively than we really are. . The cognitive aspects of depression and anxiety are sometimes observed as an identifiable pattern of poor performance on a neuropsychological evaluation, but it is also possible that all scores on an evaluation are within normal limits. . Regardless of the test scores, distress related to depression and anxiety can interfere with the ability to make use of your cognitive resources and function optimally across settings such as work or school, maintaining the home and responsibilities, and personal relationships. . Fortunately, there are treatments for depression and anxiety, and when mood improves, cognitive functioning in daily life often improves. . Treatment options include  psychotherapy, medications (e.g., antidepressants), and behavioral changes, such as increasing your involvement in enjoyable activities, increasing the amount of exercise you are getting, and maintaining a regular routine.

## 2017-07-05 DIAGNOSIS — F321 Major depressive disorder, single episode, moderate: Secondary | ICD-10-CM | POA: Insufficient documentation

## 2017-07-09 ENCOUNTER — Ambulatory Visit (INDEPENDENT_AMBULATORY_CARE_PROVIDER_SITE_OTHER): Payer: Medicaid Other | Admitting: Psychiatry

## 2017-07-09 DIAGNOSIS — F411 Generalized anxiety disorder: Secondary | ICD-10-CM | POA: Diagnosis not present

## 2017-07-09 DIAGNOSIS — F4310 Post-traumatic stress disorder, unspecified: Secondary | ICD-10-CM

## 2017-07-10 ENCOUNTER — Encounter (INDEPENDENT_AMBULATORY_CARE_PROVIDER_SITE_OTHER): Payer: Self-pay | Admitting: Surgery

## 2017-07-19 DIAGNOSIS — R5382 Chronic fatigue, unspecified: Secondary | ICD-10-CM | POA: Insufficient documentation

## 2017-07-27 ENCOUNTER — Other Ambulatory Visit: Payer: Self-pay | Admitting: Family Medicine

## 2017-08-15 ENCOUNTER — Telehealth: Payer: Self-pay | Admitting: Neurology

## 2017-08-15 NOTE — Telephone Encounter (Signed)
Have attempted to reach Pt x2, is ringing busy

## 2017-08-15 NOTE — Telephone Encounter (Signed)
If patient is at the pharmacy, call can be transferred to refill team.  1.     Which medications need to be refilled? (please list name of each medication and dose if know) Inhalers and whatever else Dr. Everlena Cooper has prescribed for her.   2.     Which pharmacy/location (including street and city if local pharmacy) is medication to be sent to? [facility] in [place]  3.     Do they need a 30 or 90 day supply? 90 Day Supply.

## 2017-08-20 ENCOUNTER — Ambulatory Visit: Payer: Medicaid Other | Admitting: Psychiatry

## 2017-08-22 NOTE — Telephone Encounter (Signed)
Attempted to reach Pt again, still ringing busy

## 2017-08-28 ENCOUNTER — Ambulatory Visit (INDEPENDENT_AMBULATORY_CARE_PROVIDER_SITE_OTHER): Payer: Medicaid Other | Admitting: Orthopaedic Surgery

## 2017-08-30 ENCOUNTER — Other Ambulatory Visit: Payer: Self-pay | Admitting: Family Medicine

## 2017-09-03 ENCOUNTER — Ambulatory Visit: Payer: Medicaid Other | Admitting: Psychiatry

## 2017-09-17 ENCOUNTER — Ambulatory Visit: Payer: Medicaid Other | Admitting: Psychiatry

## 2017-09-21 ENCOUNTER — Other Ambulatory Visit: Payer: Self-pay | Admitting: Gastroenterology

## 2017-09-21 ENCOUNTER — Other Ambulatory Visit: Payer: Self-pay | Admitting: Family Medicine

## 2017-09-21 DIAGNOSIS — K5900 Constipation, unspecified: Secondary | ICD-10-CM

## 2017-09-21 DIAGNOSIS — K649 Unspecified hemorrhoids: Secondary | ICD-10-CM

## 2017-11-19 ENCOUNTER — Other Ambulatory Visit: Payer: Self-pay | Admitting: Neurology

## 2017-12-03 ENCOUNTER — Ambulatory Visit (INDEPENDENT_AMBULATORY_CARE_PROVIDER_SITE_OTHER): Payer: Medicaid Other | Admitting: Orthopaedic Surgery

## 2017-12-06 ENCOUNTER — Ambulatory Visit (INDEPENDENT_AMBULATORY_CARE_PROVIDER_SITE_OTHER): Payer: Medicaid Other | Admitting: Orthopaedic Surgery

## 2017-12-24 ENCOUNTER — Ambulatory Visit (INDEPENDENT_AMBULATORY_CARE_PROVIDER_SITE_OTHER): Payer: Medicaid Other | Admitting: Orthopaedic Surgery

## 2018-02-22 ENCOUNTER — Other Ambulatory Visit: Payer: Self-pay | Admitting: Neurology

## 2018-03-25 ENCOUNTER — Other Ambulatory Visit: Payer: Self-pay | Admitting: Neurology

## 2018-04-12 IMAGING — MR MR HEAD WO/W CM
9 of 13 series · 33 of 48 positions shown · IV contrast (multihance)
Comparison: None.

CLINICAL DATA: Headache, memory loss and blurred vision, 3 weeks
duration.

EXAM:
MRI HEAD WITHOUT AND WITH CONTRAST
MRV HEAD WITHOUT CONTRAST
TECHNIQUE: Multiplanar, multiecho pulse sequences of the brain and surrounding
structures were obtained without and with intravenous contrast.
Angiographic images of the intracranial venous structures were
obtained using MRV technique without intravenous contrast.
CONTRAST:  20mL MULTIHANCE GADOBENATE DIMEGLUMINE 529 MG/ML IV SOLN

[Series 3: DWI · axial · 3.0mm · 0.82mm/px · z∈[-88,+59]mm · 4 of 50 slices shown (1 of 4)]
[im 1/50]
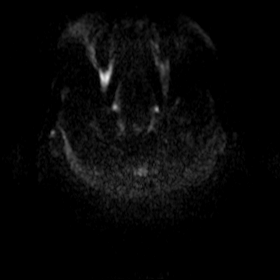
[im 17/50]
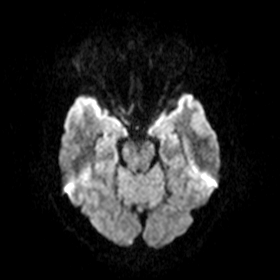
[im 33/50]
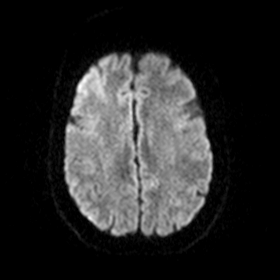
[im 50/50]
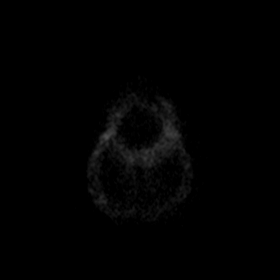

[Series 4: DWI · axial · 3.0mm · 0.82mm/px · z∈[-91,+59]mm · 4 of 51 slices shown (2 of 4)]
[im 1/51]
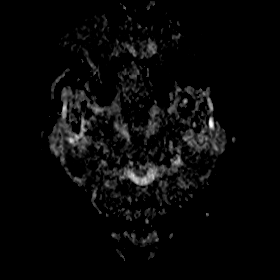
[im 17/51]
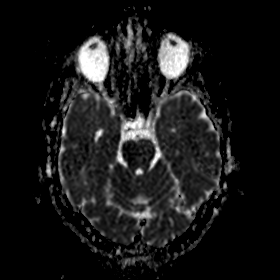
[im 34/51]
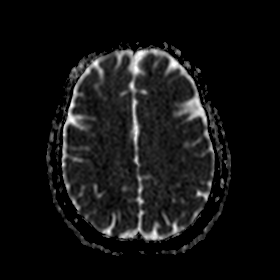
[im 51/51]
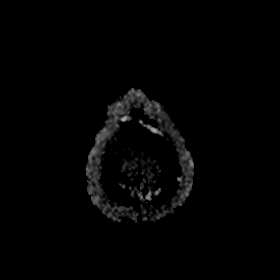

[Series 5: DWI · coronal · 5.0mm · 0.49mm/px · 6 of 68 slices shown (3 of 4)]
[im 1/68]
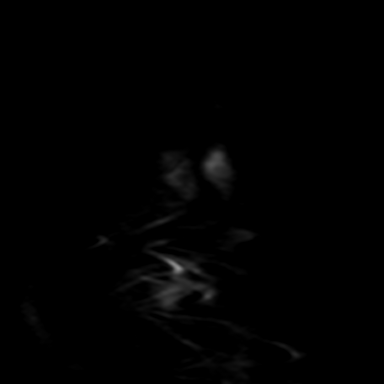
[im 14/68]
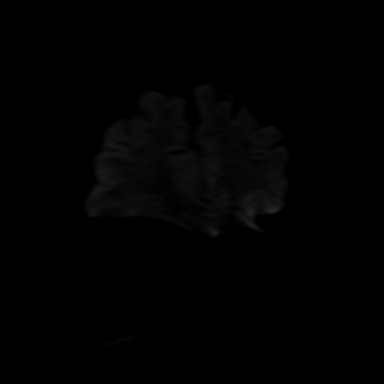
[im 27/68]
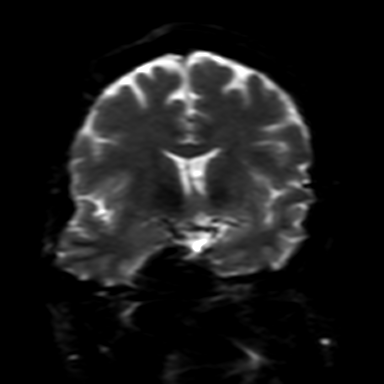
[im 41/68]
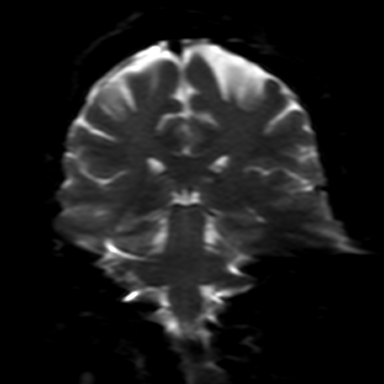
[im 54/68]
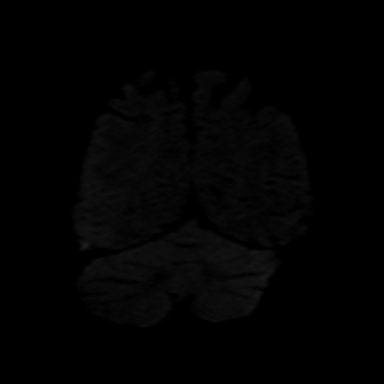
[im 68/68]
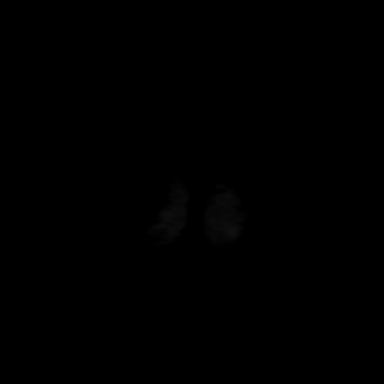

[Series 6: DWI · coronal · 5.0mm · 0.51mm/px · 3 of 34 slices shown (4 of 4)]
[im 1/34]
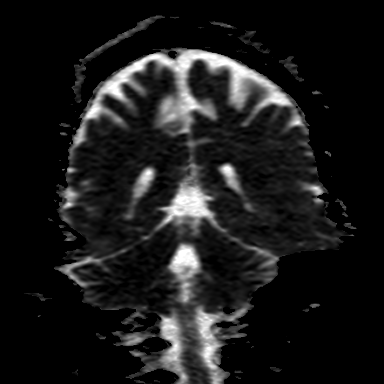
[im 17/34]
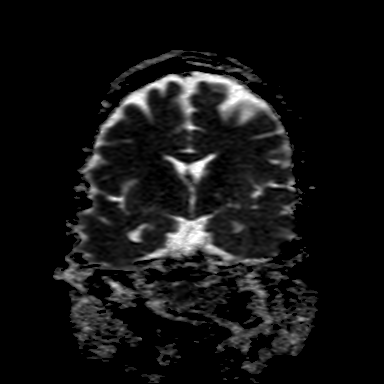
[im 34/34]
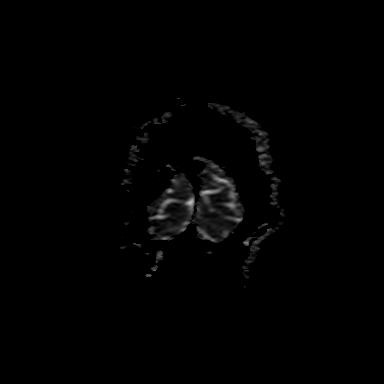

[Series 8: T2 · axial · 5.0mm · 0.66mm/px · z∈[-85,+58]mm · 2 of 23 slices shown]
[im 1/23]
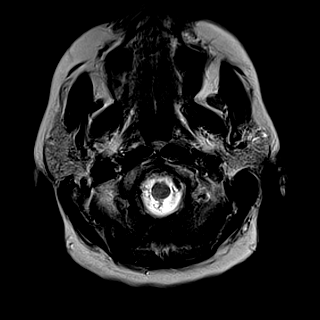
[im 23/23]
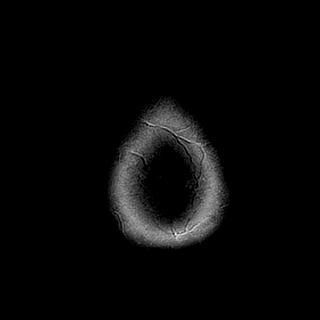

[Series 9: FLAIR · axial · 5.0mm · 0.82mm/px · z∈[-85,+58]mm · 2 of 23 slices shown]
[im 1/23]
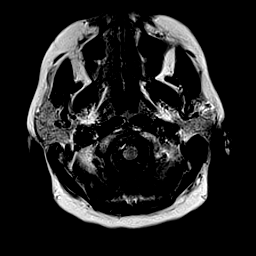
[im 23/23]
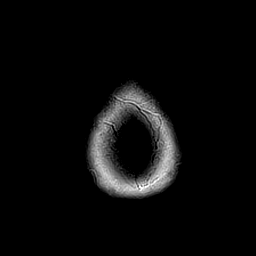

[Series 13: T1 post-contrast · axial · 2.0mm · 0.45mm/px · z∈[-116,+90]mm · 8 of 104 slices shown (1 of 3)]
[im 1/104]
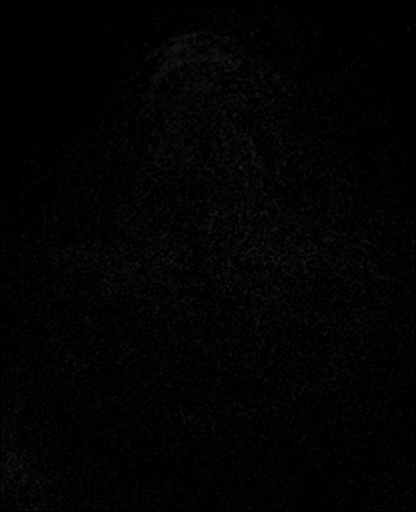
[im 13/104]
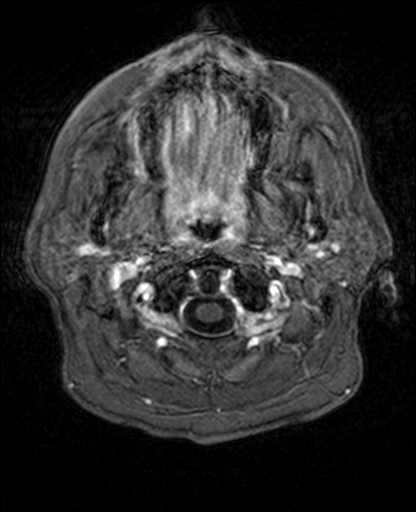
[im 26/104]
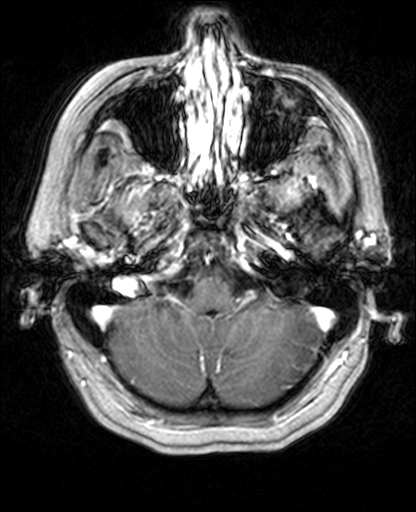
[im 39/104]
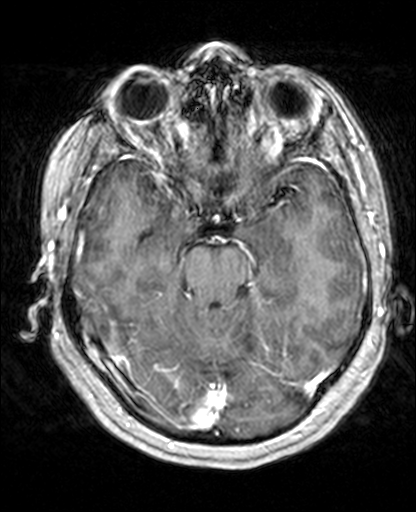
[im 65/104]
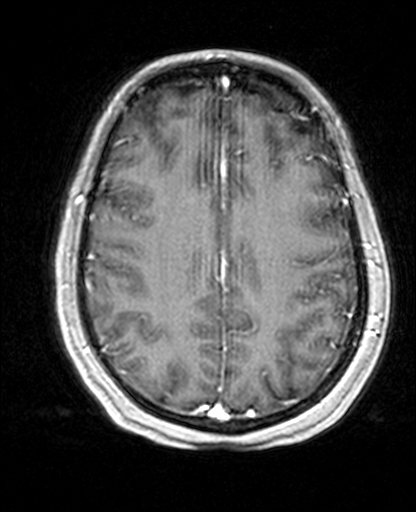
[im 78/104]
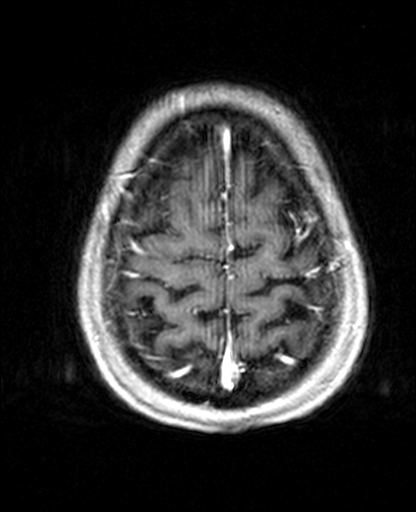
[im 91/104]
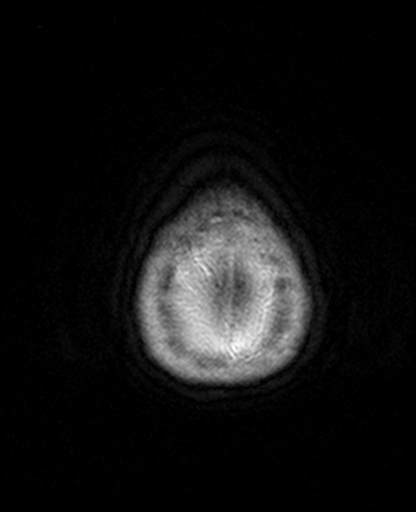
[im 104/104]
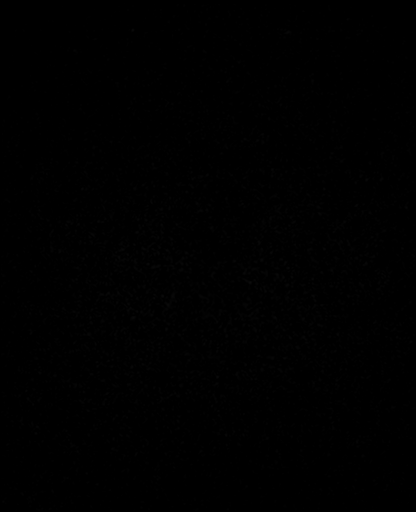

[Series 14: T1 post-contrast · coronal · 5.0mm · 0.39mm/px · 2 of 24 slices shown (2 of 3)]
[im 1/24]
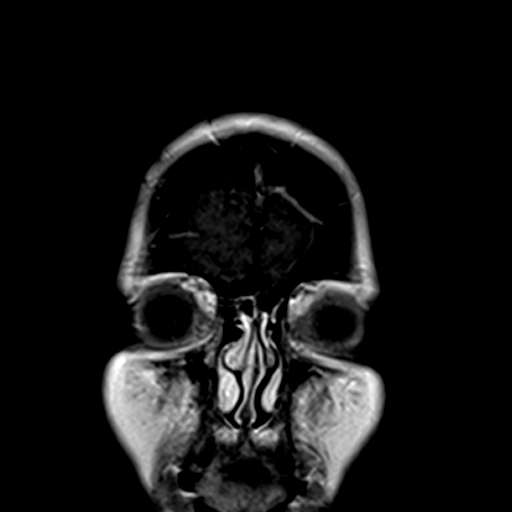
[im 24/24]
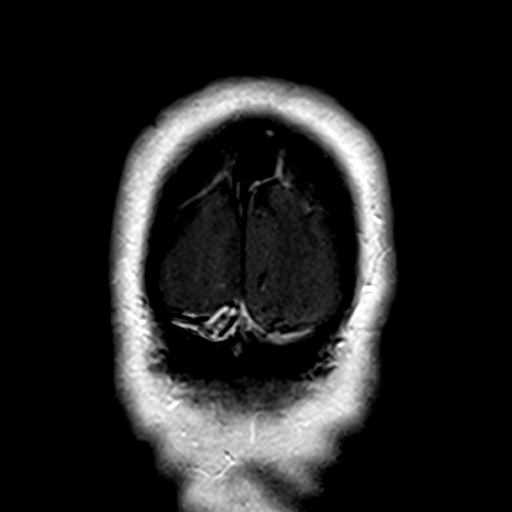

[Series 15: T1 post-contrast · sagittal · 5.0mm · 0.43mm/px · 2 of 21 slices shown (3 of 3)]
[im 1/21]
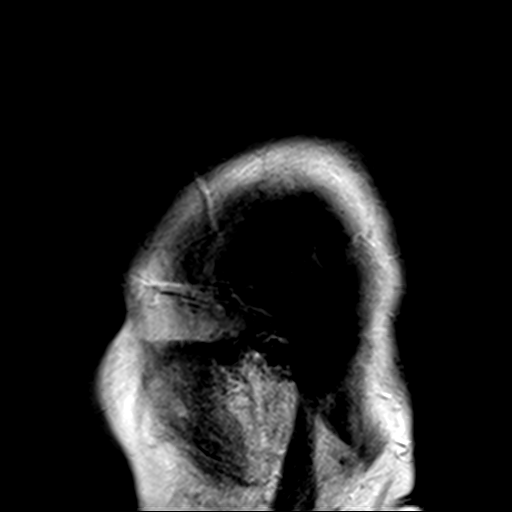
[im 21/21]
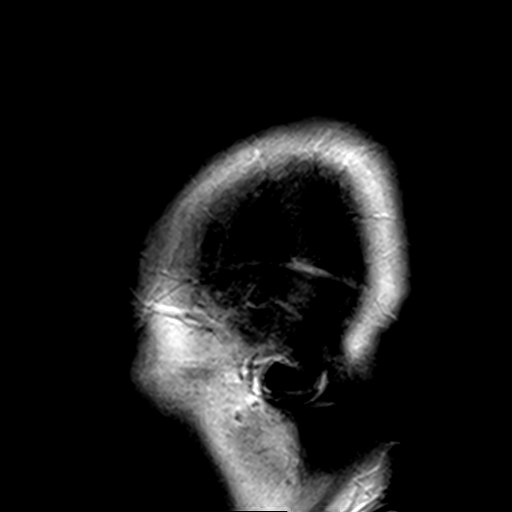

[33 of 48 positions shown; findings below may reference images not displayed]

FINDINGS: Brain: The brain has a normal appearance without evidence of
malformation, atrophy, old or acute small or large vessel
infarction, hemorrhage, hydrocephalus or extra-axial collection. No
pituitary abnormality. Abnormal enhancement occurs.

Vascular: Major vessels at the base of the brain show flow.

Skull and upper cervical spine: Normal

Sinuses/Orbits: Clear/normal

Other: None significant.

MRV: Venous structures appear normal. Superior sagittal sinus,
transverse sinuses and jugular veins are patent.
IMPRESSION: Normal MRI of the brain.

Normal intracranial MR venography.

No cause of the presenting symptoms is identified.

## 2018-04-12 IMAGING — MR MR MRV HEAD W/O CM
1 of 2 series · 44 of 48 positions shown · IV contrast (multihance)
Comparison: None.

CLINICAL DATA: Headache, memory loss and blurred vision, 3 weeks
duration.

EXAM:
MRI HEAD WITHOUT AND WITH CONTRAST
MRV HEAD WITHOUT CONTRAST
TECHNIQUE: Multiplanar, multiecho pulse sequences of the brain and surrounding
structures were obtained without and with intravenous contrast.
Angiographic images of the intracranial venous structures were
obtained using MRV technique without intravenous contrast.
CONTRAST:  20mL MULTIHANCE GADOBENATE DIMEGLUMINE 529 MG/ML IV SOLN

[Series 1: tof_2d_veins · sagittal · 3.0mm · 0.98mm/px · 44 of 75 slices shown]
[im 1/75]
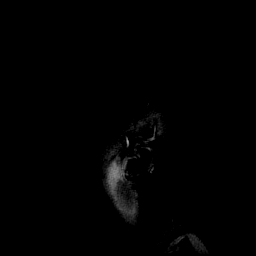
[im 2/75]
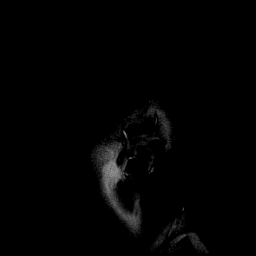
[im 4/75]
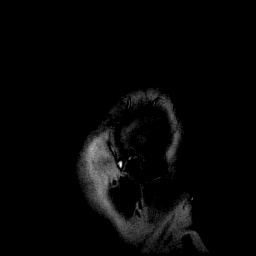
[im 6/75]
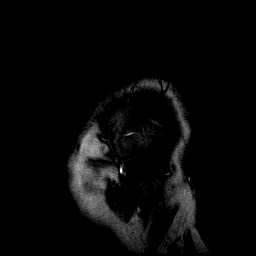
[im 7/75]
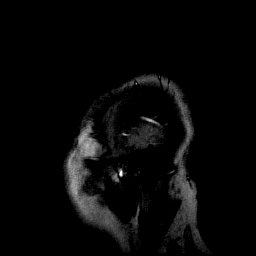
[im 9/75]
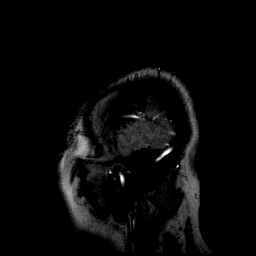
[im 11/75]
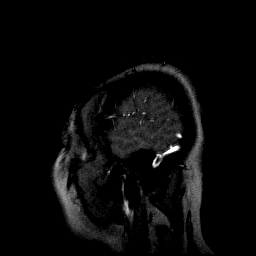
[im 13/75]
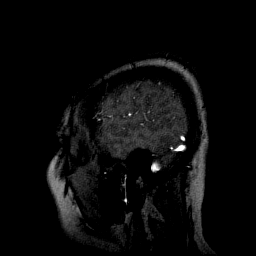
[im 14/75]
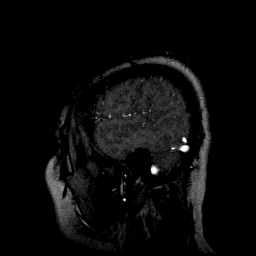
[im 16/75]
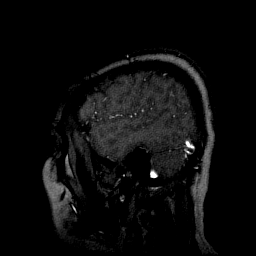
[im 18/75]
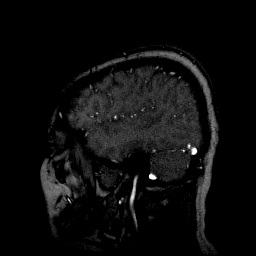
[im 19/75]
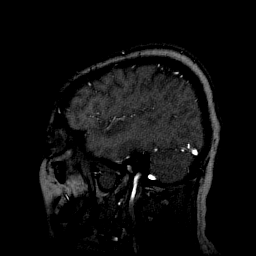
[im 21/75]
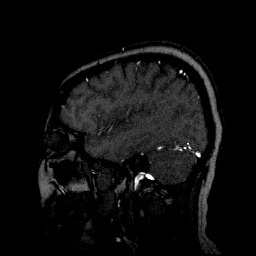
[im 23/75]
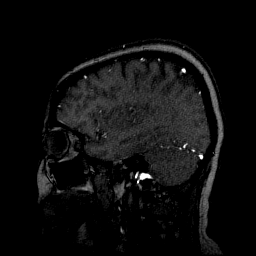
[im 25/75]
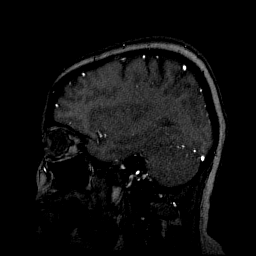
[im 26/75]
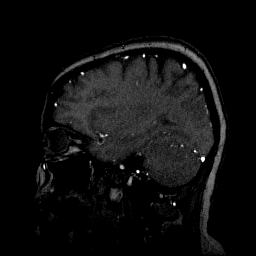
[im 28/75]
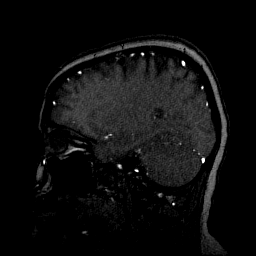
[im 30/75]
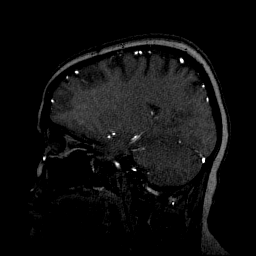
[im 31/75]
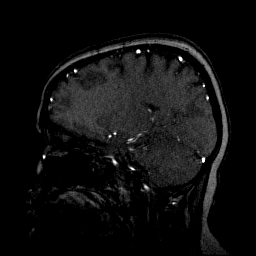
[im 33/75]
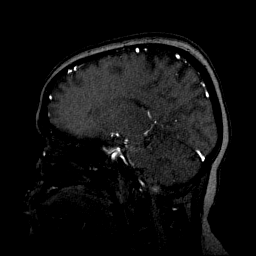
[im 35/75]
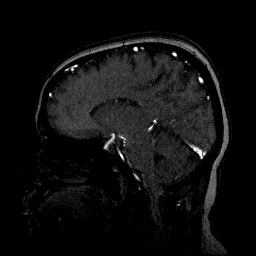
[im 37/75]
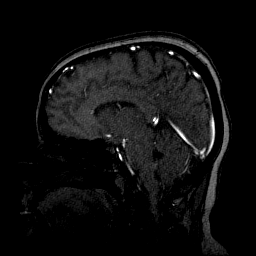
[im 38/75]
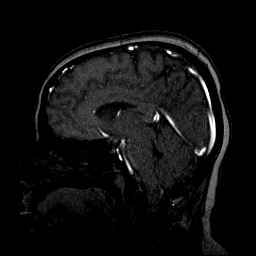
[im 40/75]
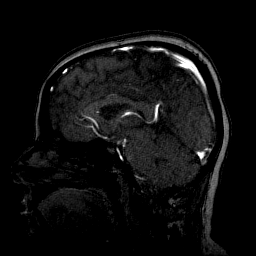
[im 42/75]
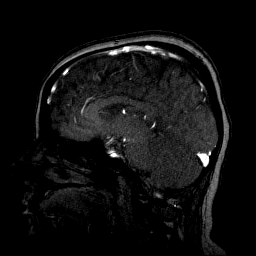
[im 44/75]
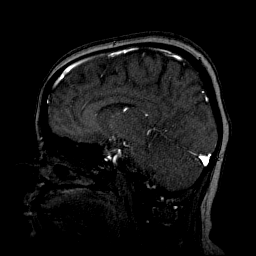
[im 45/75]
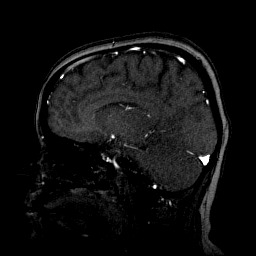
[im 47/75]
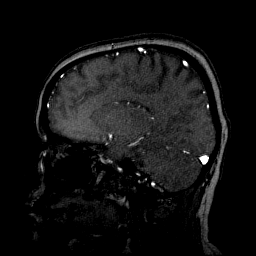
[im 49/75]
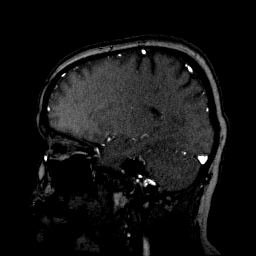
[im 50/75]
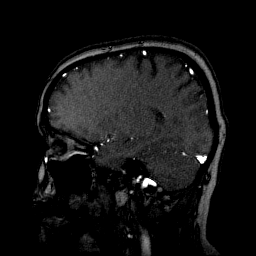
[im 52/75]
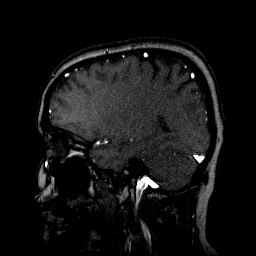
[im 54/75]
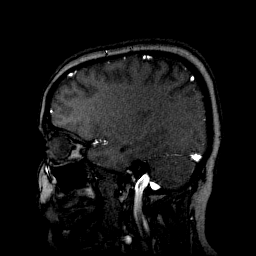
[im 56/75]
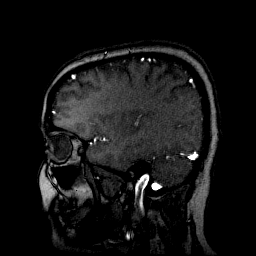
[im 57/75]
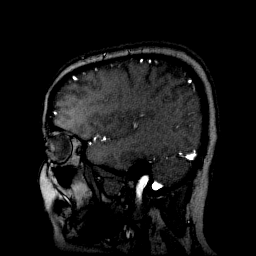
[im 59/75]
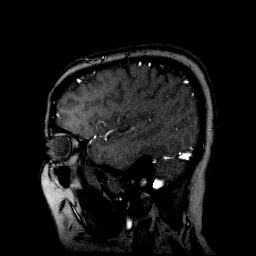
[im 61/75]
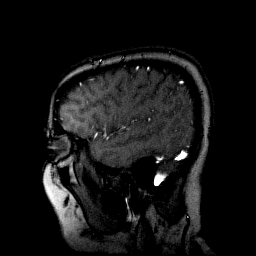
[im 62/75]
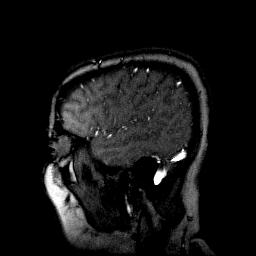
[im 64/75]
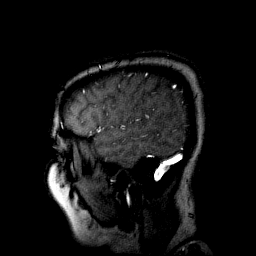
[im 66/75]
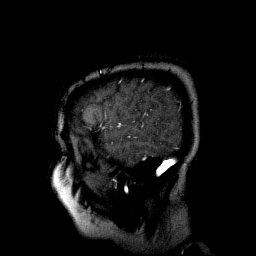
[im 68/75]
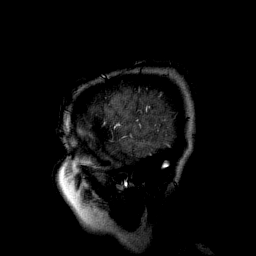
[im 69/75]
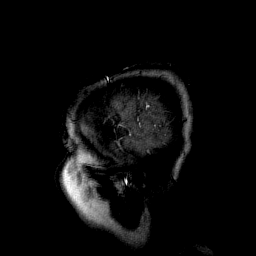
[im 71/75]
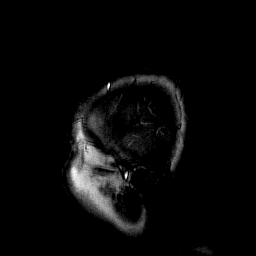
[im 73/75]
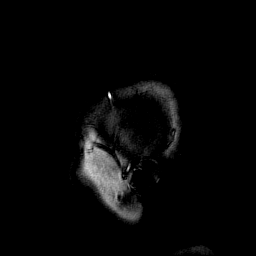
[im 75/75]
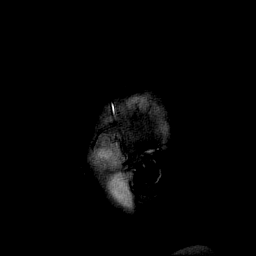

[44 of 48 positions shown; findings below may reference images not displayed]

FINDINGS: Brain: The brain has a normal appearance without evidence of
malformation, atrophy, old or acute small or large vessel
infarction, hemorrhage, hydrocephalus or extra-axial collection. No
pituitary abnormality. Abnormal enhancement occurs.

Vascular: Major vessels at the base of the brain show flow.

Skull and upper cervical spine: Normal

Sinuses/Orbits: Clear/normal

Other: None significant.

MRV: Venous structures appear normal. Superior sagittal sinus,
transverse sinuses and jugular veins are patent.
IMPRESSION: Normal MRI of the brain.

Normal intracranial MR venography.

No cause of the presenting symptoms is identified.

## 2018-05-07 ENCOUNTER — Other Ambulatory Visit: Payer: Self-pay | Admitting: Neurology

## 2018-06-05 ENCOUNTER — Encounter: Payer: Self-pay | Admitting: Diagnostic Neuroimaging

## 2018-06-05 ENCOUNTER — Encounter: Payer: Medicaid Other | Admitting: Diagnostic Neuroimaging

## 2018-06-05 ENCOUNTER — Ambulatory Visit (INDEPENDENT_AMBULATORY_CARE_PROVIDER_SITE_OTHER): Payer: Medicaid Other | Admitting: Diagnostic Neuroimaging

## 2018-06-05 DIAGNOSIS — Z0289 Encounter for other administrative examinations: Secondary | ICD-10-CM

## 2018-06-05 DIAGNOSIS — R200 Anesthesia of skin: Secondary | ICD-10-CM | POA: Diagnosis not present

## 2018-06-05 DIAGNOSIS — R202 Paresthesia of skin: Secondary | ICD-10-CM | POA: Diagnosis not present

## 2018-06-17 NOTE — Procedures (Signed)
[facility]  NCS (NERVE CONDUCTION STUDY) WITH EMG (ELECTROMYOGRAPHY) REPORT   STUDY DATE: 06/05/18 PATIENT NAME: Jessica Vaughn DOB: Feb 27, 1966 MRN: [id number]  ORDERING CLINICIAN: Denton Meek, MD  TECHNOLOGIST: Durenda Age ELECTROMYOGRAPHER: Glenford Bayley. Namira Rosekrans, MD  CLINICAL INFORMATION: 53 year old female with intermittent left hand numbness and pain, associated with left hand swelling.  Symptoms started September 2019.  FINDINGS:  NERVE CONDUCTION STUDY:  Bilateral median and right ulnar motor responses are normal.    Left ulnar motor response has normal distal latency, borderline decreased amplitude, normal conduction velocity.  Bilateral ulnar F-wave latencies are normal.  Bilateral median and ulnar sensory responses are normal.   NEEDLE ELECTROMYOGRAPHY:  Needle examination of left deltoid, biceps, first dorsal interosseous muscles is normal.   IMPRESSION:   This study demonstrates: - No electrodiagnostic evidence of widespread underlying large fiber neuropathy or cervical radiculopathy. - Left ulnar motor response has borderline decreased amplitude, which may be due to technical artifact.  No additional information to suggest an underlying ulnar neuropathy.     INTERPRETING PHYSICIAN:  Suanne Marker, MD Certified in Neurology, Neurophysiology and Neuroimaging  [facility] [address] [place], [place] [address] [phone]   [facility]    Nerve / Sites Muscle Latency Ref. Amplitude Ref. Rel Amp Segments Distance Velocity Ref. Area    ms ms mV mV %  cm m/s m/s mVms  R Median - APB     Wrist APB 2.4 ?4.4 6.6 ?4.0 100 Wrist - APB 7   26.4     Upper arm APB 6.0  6.8  103 Upper arm - Wrist 20 55 ?49 24.3  L Median - APB     Wrist APB 2.4 ?4.4 4.8 ?4.0 100 Wrist - APB 7   16.3     Upper arm APB 5.9  4.7  99.2 Upper arm - Wrist 20 58 ?49 15.8  R Ulnar - ADM     Wrist ADM 2.1 ?3.3 6.1 ?6.0 100 Wrist -  ADM 7   25.2     B.Elbow ADM 4.7  5.6  91.8 B.Elbow - Wrist 15 59 ?49 23.6     A.Elbow ADM 6.3  5.4  97.3 A.Elbow - B.Elbow 10 62 ?49 23.9         A.Elbow - Wrist      L Ulnar - ADM     Wrist ADM 2.0 ?3.3 5.8 ?6.0 100 Wrist - ADM 7   37.3     B.Elbow ADM 4.8  5.4  93.2 B.Elbow - Wrist 16 57 ?49 21.7     A.Elbow ADM 6.3  5.3  97.8 A.Elbow - B.Elbow 10 69 ?49 21.7         A.Elbow - Wrist                 SNC    Nerve / Sites Rec. Site Peak Lat Ref.  Amp Ref. Segments Distance    ms ms V V  cm  R Median - Orthodromic (Dig II, Mid palm)     Dig II Wrist 2.6 ?3.4 11 ?10 Dig II - Wrist 13  L Median - Orthodromic (Dig II, Mid palm)     Dig II Wrist 2.4 ?3.4 14 ?10 Dig II - Wrist 13  R Ulnar - Orthodromic, (Dig V, Mid palm)     Dig V Wrist 2.2 ?3.1 7 ?5 Dig V - Wrist 11  L Ulnar - Orthodromic, (Dig V, Mid palm)  Dig V Wrist 2.1 ?3.1 8 ?5 Dig V - Wrist 8511              F  Wave    Nerve F Lat Ref.   ms ms  R Ulnar - ADM 24.8 ?32.0  L Ulnar - ADM 24.8 ?32.0         EMG full       EMG Summary Table    Spontaneous MUAP Recruitment  Muscle IA Fib PSW Fasc Other Amp Dur. Poly Pattern  L. Deltoid Normal None None None _______ Normal Normal Normal Normal  L. Biceps brachii Normal None None None _______ Normal Normal Normal Normal  L. First dorsal interosseous Normal None None None _______ Normal Normal Normal Normal

## 2019-01-14 DIAGNOSIS — M0579 Rheumatoid arthritis with rheumatoid factor of multiple sites without organ or systems involvement: Secondary | ICD-10-CM | POA: Insufficient documentation

## 2019-01-28 ENCOUNTER — Ambulatory Visit: Payer: Medicaid Other | Admitting: Orthopaedic Surgery

## 2019-03-11 ENCOUNTER — Ambulatory Visit: Payer: Medicaid Other | Admitting: Orthopaedic Surgery

## 2019-03-25 ENCOUNTER — Ambulatory Visit: Payer: Medicaid Other | Admitting: Orthopaedic Surgery

## 2019-04-08 ENCOUNTER — Ambulatory Visit: Payer: Medicaid Other | Admitting: Orthopaedic Surgery

## 2019-04-15 ENCOUNTER — Ambulatory Visit: Payer: Medicaid Other | Admitting: Orthopaedic Surgery

## 2019-04-28 ENCOUNTER — Ambulatory Visit: Payer: Medicaid Other | Admitting: Orthopaedic Surgery

## 2019-05-29 DIAGNOSIS — G8929 Other chronic pain: Secondary | ICD-10-CM | POA: Insufficient documentation

## 2019-05-29 DIAGNOSIS — M25562 Pain in left knee: Secondary | ICD-10-CM | POA: Insufficient documentation

## 2020-05-31 ENCOUNTER — Telehealth (HOSPITAL_COMMUNITY): Payer: Medicaid Other | Admitting: Psychiatry

## 2020-06-30 ENCOUNTER — Encounter (HOSPITAL_COMMUNITY): Payer: Self-pay | Admitting: Psychiatry

## 2020-06-30 ENCOUNTER — Telehealth (INDEPENDENT_AMBULATORY_CARE_PROVIDER_SITE_OTHER): Payer: Medicare Other | Admitting: Psychiatry

## 2020-06-30 DIAGNOSIS — M797 Fibromyalgia: Secondary | ICD-10-CM | POA: Diagnosis not present

## 2020-06-30 DIAGNOSIS — F411 Generalized anxiety disorder: Secondary | ICD-10-CM

## 2020-06-30 DIAGNOSIS — F4310 Post-traumatic stress disorder, unspecified: Secondary | ICD-10-CM | POA: Diagnosis not present

## 2020-06-30 DIAGNOSIS — F0630 Mood disorder due to known physiological condition, unspecified: Secondary | ICD-10-CM

## 2020-06-30 DIAGNOSIS — F321 Major depressive disorder, single episode, moderate: Secondary | ICD-10-CM

## 2020-06-30 MED ORDER — PRAZOSIN HCL 2 MG PO CAPS: 2.0000 mg | ORAL_CAPSULE | Freq: Every day | ORAL | 1 refills | Status: DC

## 2020-06-30 MED ORDER — BREXPIPRAZOLE 1 MG PO TABS: 1.0000 mg | ORAL_TABLET | Freq: Every day | ORAL | 1 refills | Status: DC

## 2020-06-30 MED ORDER — LAMOTRIGINE 200 MG PO TABS: 200.0000 mg | ORAL_TABLET | Freq: Two times a day (BID) | ORAL | 1 refills | Status: DC
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
## 2020-08-11 ENCOUNTER — Telehealth (INDEPENDENT_AMBULATORY_CARE_PROVIDER_SITE_OTHER): Payer: Medicaid Other | Admitting: Psychiatry

## 2020-08-11 ENCOUNTER — Other Ambulatory Visit: Payer: Self-pay

## 2020-08-11 ENCOUNTER — Encounter (HOSPITAL_COMMUNITY): Payer: Self-pay | Admitting: Psychiatry

## 2020-08-11 DIAGNOSIS — F0630 Mood disorder due to known physiological condition, unspecified: Secondary | ICD-10-CM

## 2020-08-11 DIAGNOSIS — F321 Major depressive disorder, single episode, moderate: Secondary | ICD-10-CM | POA: Diagnosis not present

## 2020-08-11 MED ORDER — BUPROPION HCL ER (SR) 150 MG PO TB12: ORAL_TABLET | ORAL | 1 refills | Status: DC

## 2020-08-11 MED ORDER — ALPRAZOLAM 0.5 MG PO TABS: 0.5000 mg | ORAL_TABLET | Freq: Every evening | ORAL | 1 refills | Status: DC | PRN

## 2020-08-11 MED ORDER — BREXPIPRAZOLE 1 MG PO TABS: 1.5000 mg | ORAL_TABLET | Freq: Every day | ORAL | 1 refills | Status: DC

## 2020-08-11 MED ORDER — DULOXETINE HCL 60 MG PO CPEP: 60.0000 mg | ORAL_CAPSULE | Freq: Two times a day (BID) | ORAL | 1 refills | Status: DC
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
## 2020-08-22 ENCOUNTER — Ambulatory Visit (INDEPENDENT_AMBULATORY_CARE_PROVIDER_SITE_OTHER): Payer: Medicare Other | Admitting: Licensed Clinical Social Worker

## 2020-08-22 DIAGNOSIS — F0630 Mood disorder due to known physiological condition, unspecified: Secondary | ICD-10-CM

## 2020-08-22 DIAGNOSIS — F321 Major depressive disorder, single episode, moderate: Secondary | ICD-10-CM

## 2020-08-22 DIAGNOSIS — F411 Generalized anxiety disorder: Secondary | ICD-10-CM | POA: Diagnosis not present

## 2020-08-22 DIAGNOSIS — F4310 Post-traumatic stress disorder, unspecified: Secondary | ICD-10-CM | POA: Diagnosis not present
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
## 2020-09-26 ENCOUNTER — Ambulatory Visit (HOSPITAL_COMMUNITY): Payer: Medicare Other | Admitting: Licensed Clinical Social Worker

## 2020-10-10 ENCOUNTER — Encounter (HOSPITAL_COMMUNITY): Payer: Self-pay | Admitting: Psychiatry

## 2020-10-10 ENCOUNTER — Telehealth (INDEPENDENT_AMBULATORY_CARE_PROVIDER_SITE_OTHER): Payer: Medicare Other | Admitting: Psychiatry

## 2020-10-10 ENCOUNTER — Ambulatory Visit (INDEPENDENT_AMBULATORY_CARE_PROVIDER_SITE_OTHER): Payer: Medicare Other | Admitting: Licensed Clinical Social Worker

## 2020-10-10 DIAGNOSIS — F411 Generalized anxiety disorder: Secondary | ICD-10-CM

## 2020-10-10 DIAGNOSIS — M797 Fibromyalgia: Secondary | ICD-10-CM

## 2020-10-10 DIAGNOSIS — F321 Major depressive disorder, single episode, moderate: Secondary | ICD-10-CM

## 2020-10-10 DIAGNOSIS — F4310 Post-traumatic stress disorder, unspecified: Secondary | ICD-10-CM

## 2020-10-10 DIAGNOSIS — F0630 Mood disorder due to known physiological condition, unspecified: Secondary | ICD-10-CM | POA: Diagnosis not present

## 2020-10-10 MED ORDER — DULOXETINE HCL 60 MG PO CPEP: 60.0000 mg | ORAL_CAPSULE | Freq: Two times a day (BID) | ORAL | 1 refills | Status: DC

## 2020-10-10 MED ORDER — LAMOTRIGINE 200 MG PO TABS: 200.0000 mg | ORAL_TABLET | Freq: Two times a day (BID) | ORAL | 1 refills | Status: DC

## 2020-10-10 MED ORDER — BREXPIPRAZOLE 1 MG PO TABS: 1.5000 mg | ORAL_TABLET | Freq: Every day | ORAL | 1 refills | Status: DC

## 2020-10-10 MED ORDER — BUPROPION HCL ER (SR) 150 MG PO TB12: ORAL_TABLET | ORAL | 1 refills | Status: DC

## 2020-10-10 MED ORDER — ALPRAZOLAM 0.5 MG PO TABS: 0.5000 mg | ORAL_TABLET | Freq: Every evening | ORAL | 1 refills | Status: DC | PRN
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
## 2020-11-01 ENCOUNTER — Ambulatory Visit (HOSPITAL_COMMUNITY): Payer: Medicare Other | Admitting: Licensed Clinical Social Worker

## 2020-11-01 NOTE — Progress Notes (Signed)
Therapist contacted patient through email for session and she did not respond. Session is a no show.  

## 2020-11-04 ENCOUNTER — Encounter: Payer: Self-pay | Admitting: Neurology

## 2020-11-24 ENCOUNTER — Ambulatory Visit (INDEPENDENT_AMBULATORY_CARE_PROVIDER_SITE_OTHER): Payer: Medicare Other | Admitting: Licensed Clinical Social Worker

## 2020-11-24 DIAGNOSIS — F411 Generalized anxiety disorder: Secondary | ICD-10-CM

## 2020-11-24 DIAGNOSIS — F4310 Post-traumatic stress disorder, unspecified: Secondary | ICD-10-CM | POA: Diagnosis not present

## 2020-11-24 DIAGNOSIS — F321 Major depressive disorder, single episode, moderate: Secondary | ICD-10-CM | POA: Diagnosis not present

## 2020-11-24 DIAGNOSIS — F0630 Mood disorder due to known physiological condition, unspecified: Secondary | ICD-10-CM | POA: Diagnosis not present
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
## 2020-12-07 ENCOUNTER — Ambulatory Visit (INDEPENDENT_AMBULATORY_CARE_PROVIDER_SITE_OTHER): Payer: Medicare Other | Admitting: Licensed Clinical Social Worker

## 2020-12-07 DIAGNOSIS — F321 Major depressive disorder, single episode, moderate: Secondary | ICD-10-CM | POA: Diagnosis not present

## 2020-12-07 DIAGNOSIS — F4310 Post-traumatic stress disorder, unspecified: Secondary | ICD-10-CM | POA: Diagnosis not present

## 2020-12-07 DIAGNOSIS — F0630 Mood disorder due to known physiological condition, unspecified: Secondary | ICD-10-CM | POA: Diagnosis not present

## 2020-12-07 DIAGNOSIS — F411 Generalized anxiety disorder: Secondary | ICD-10-CM | POA: Diagnosis not present

## 2020-12-07 DIAGNOSIS — M797 Fibromyalgia: Secondary | ICD-10-CM
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
## 2020-12-08 ENCOUNTER — Telehealth (HOSPITAL_COMMUNITY): Payer: Self-pay | Admitting: Psychiatry

## 2020-12-08 MED ORDER — ALPRAZOLAM 0.5 MG PO TABS: 0.5000 mg | ORAL_TABLET | Freq: Every evening | ORAL | 1 refills | Status: DC | PRN

## 2020-12-08 NOTE — Telephone Encounter (Signed)
Per pt Needs refill on xanax [facility] in [place]

## 2020-12-22 ENCOUNTER — Ambulatory Visit (HOSPITAL_COMMUNITY): Payer: Medicare Other | Admitting: Licensed Clinical Social Worker

## 2021-01-02 ENCOUNTER — Telehealth (INDEPENDENT_AMBULATORY_CARE_PROVIDER_SITE_OTHER): Payer: Medicare Other | Admitting: Psychiatry

## 2021-01-02 ENCOUNTER — Encounter (HOSPITAL_COMMUNITY): Payer: Self-pay | Admitting: Psychiatry

## 2021-01-02 DIAGNOSIS — F321 Major depressive disorder, single episode, moderate: Secondary | ICD-10-CM

## 2021-01-02 DIAGNOSIS — F411 Generalized anxiety disorder: Secondary | ICD-10-CM

## 2021-01-02 DIAGNOSIS — M797 Fibromyalgia: Secondary | ICD-10-CM

## 2021-01-02 MED ORDER — BUPROPION HCL ER (SR) 150 MG PO TB12: ORAL_TABLET | ORAL | 1 refills | Status: DC

## 2021-01-02 MED ORDER — PRAZOSIN HCL 2 MG PO CAPS: 2.0000 mg | ORAL_CAPSULE | Freq: Every day | ORAL | 1 refills | Status: DC

## 2021-01-02 MED ORDER — LAMOTRIGINE 200 MG PO TABS: 200.0000 mg | ORAL_TABLET | Freq: Two times a day (BID) | ORAL | 1 refills | Status: DC

## 2021-01-02 MED ORDER — DULOXETINE HCL 60 MG PO CPEP: 60.0000 mg | ORAL_CAPSULE | Freq: Two times a day (BID) | ORAL | 1 refills | Status: DC

## 2021-01-02 MED ORDER — BREXPIPRAZOLE 1 MG PO TABS: 1.5000 mg | ORAL_TABLET | Freq: Every day | ORAL | 1 refills | Status: DC

## 2021-01-02 NOTE — Progress Notes (Signed)
[facility] Follow up visit  Patient Identification: Jessica Vaughn MRN:  [id number] Date of Evaluation:  01/02/2021 Referral Source: [facility] Chief Complaint:  follow up depression Visit Diagnosis:    ICD-10-CM   1. Current moderate episode of major depressive disorder without prior episode (HCC)  F32.1     2. GAD (generalized anxiety disorder)  F41.1     3. Fibromyalgia  M79.7      Virtual Visit via Video Note  I connected with Jessica Vaughn on 01/02/21 at  1:00 PM EDT by a video enabled telemedicine application and verified that I am speaking with the correct person using two identifiers.  Location: Patient: home Provider: home office   I discussed the limitations of evaluation and management by telemedicine and the availability of in person appointments. The patient expressed understanding and agreed to proceed.     I discussed the assessment and treatment plan with the patient. The patient was provided an opportunity to ask questions and all were answered. The patient agreed with the plan and demonstrated an understanding of the instructions.   The patient was advised to call back or seek an in-person evaluation if the symptoms worsen or if the condition fails to improve as anticipated.  I provided 12 minutes of non-face-to-face time during this encounte      History of Present Illness: Patient is a 55 years old single Caucasian female initially  referred by pain clinic for management and to establish care for depression and possible bipolar.  she suffers from chronic pain rheumatoid arthritis   Arthritis and migraines keep her at home she is trying to get a new medication approved for migraines  Her son is going to join [employer] she is happy both on the same side she is worried about empty nest overall medication are keeping some balance still has subdued days depending upon the circumstances and stressors she is in therapy that helps  Takes xanax once  or twice if needed Sons are supportive  Therapy is helping  Aggravating factors; medical,  fibromyalgia past trauma, worries related to her son Modifying factors; her boys  Duration since young age   Past Psychiatric History: depression, anxiety   Previous Psychotropic Medications: Yes     Past Medical History:  Past Medical History:  Diagnosis Date   Anxiety    Arthritis    COPD (chronic obstructive pulmonary disease) (HCC)    Depression    Neuromuscular disorder (HCC)    Osteoporosis    Seizures (HCC)    Sleep apnea    Ulcer     Past Surgical History:  Procedure Laterality Date   ROTATOR CUFF REPAIR Left 2009    Family Psychiatric History: mom : depression  Family History:  Family History  Problem Relation Age of Onset   Depression Mother    Cancer Father    Diabetes Father    Hypertension Father    Drug abuse Sister    Colon cancer Neg Hx     Social History:   Social History   Socioeconomic History   Marital status: Divorced    Spouse name: Not on file   Number of children: 2   Years of education: Not on file   Highest education level: Not on file  Occupational History   Not on file  Tobacco Use   Smoking status: Every Day    Packs/day: 0.50    Types: Cigarettes   Smokeless tobacco: Never  Vaping Use   Vaping Use:  Never used  Substance and Sexual Activity   Alcohol use: No   Drug use: No   Sexual activity: Yes    Birth control/protection: Post-menopausal  Other Topics Concern   Not on file  Social History Narrative   Lives with 2 sons, 1 is disabled. Moved her recently for [place].    Social Determinants of Health   Financial Resource Strain: Not on file  Food Insecurity: Not on file  Transportation Needs: Not on file  Physical Activity: Not on file  Stress: Not on file  Social Connections: Not on file      Allergies:   Allergies  Allergen Reactions   Meloxicam Other (See Comments)    Interacts with other meds    Tramadol Other (See Comments)    Interacts with other meds   Robaxin [Methocarbamol]    Zanaflex [Tizanidine Hcl]     Metabolic Disorder Labs: Lab Results  Component Value Date   HGBA1C 5.5 11/06/2016   No results found for: PROLACTIN Lab Results  Component Value Date   CHOL 238 (H) 11/06/2016   TRIG 144 11/06/2016   HDL 52 11/06/2016   CHOLHDL 4.6 (H) 11/06/2016   LDLCALC 157 (H) 11/06/2016   Lab Results  Component Value Date   TSH 1.740 11/13/2016    Therapeutic Level Labs: No results found for: LITHIUM No results found for: CBMZ No results found for: VALPROATE  Current Medications: Current Outpatient Medications  Medication Sig Dispense Refill   albuterol (PROVENTIL HFA;VENTOLIN HFA) 108 (90 Base) MCG/ACT inhaler Inhale 1 puff into the lungs every 6 (six) hours as needed for wheezing or shortness of breath. 1 Inhaler 6   ALPRAZolam (XANAX) 0.5 MG tablet Take 1 tablet (0.5 mg total) by mouth at bedtime as needed. 30 tablet 1   AMITIZA 24 MCG capsule TAKE 1 CAPSULE BY MOUTH TWICE DAILY WITH MEALS 60 capsule 5   atorvastatin (LIPITOR) 40 MG tablet Take 1 tablet (40 mg total) by mouth daily. 90 tablet 3   baclofen (LIORESAL) 10 MG tablet Take 10 mg by mouth 3 (three) times daily.     brexpiprazole (REXULTI) 1 MG TABS tablet Take 1.5 tablets (1.5 mg total) by mouth at bedtime. 30 tablet 1   buPROPion (WELLBUTRIN SR) 150 MG 12 hr tablet SMARTSIG:1 Tablet(s) By Mouth Every 12 Hours 30 tablet 1   cholecalciferol (VITAMIN D) 1000 units tablet Take 1 tablet (1,000 Units total) by mouth daily. 30 tablet 5   DULoxetine (CYMBALTA) 60 MG capsule Take 1 capsule (60 mg total) by mouth 2 (two) times daily. 60 capsule 1   eletriptan (RELPAX) 40 MG tablet TAKE ONE TABLET BY MOUTH AT EARLIEST ONSET OF MIGRAINE, MAY REPEAT ONCE IN 2 HOURS IF HEADACHE PERSISTS OR RECURS 9 tablet 3   Fluticasone-Salmeterol (ADVAIR) 250-50 MCG/DOSE AEPB Inhale 1 puff into the lungs 2 (two) times daily. 60 each  5   folic acid (FOLVITE) 1 MG tablet Take 1 tablet (1 mg total) by mouth daily. 30 tablet 5   furosemide (LASIX) 40 MG tablet Take 1 tablet (40 mg total) by mouth daily. 30 tablet 5   HYDROcodone-acetaminophen (NORCO/VICODIN) 5-325 MG tablet Take by mouth.     hydrocortisone (ANUSOL-HC) 2.5 % rectal cream Place 1 application rectally 2 (two) times daily. 30 g 1   ibuprofen (ADVIL,MOTRIN) 800 MG tablet TAKE 1 TABLET BY MOUTH EVERY 8 HOURS AS NEEDED 90 tablet 2   ibuprofen (ADVIL,MOTRIN) 800 MG tablet Take 1 tablet (800 mg total) by  mouth every 8 (eight) hours as needed. 90 tablet 3   lamoTRIgine (LAMICTAL) 200 MG tablet Take 1 tablet (200 mg total) by mouth 2 (two) times daily. 60 tablet 1   metFORMIN (GLUCOPHAGE) 500 MG tablet Take 1 tablet (500 mg total) by mouth 2 (two) times daily with a meal. 60 tablet 1   methocarbamol (ROBAXIN) 500 MG tablet Take 1 tablet (500 mg total) by mouth every 8 (eight) hours as needed for muscle spasms. 30 tablet 1   methotrexate 2.5 MG tablet TAKE 8 TABLETS BY MOUTH ONCE A WEEK 32 tablet 5   naproxen (NAPROSYN) 500 MG tablet Take one with every sumatriptan dose 60 tablet 1   pantoprazole (PROTONIX) 40 MG tablet TAKE 1 TABLET BY MOUTH ONCE DAILY 30 tablet 5   polyethylene glycol-electrolytes (TRILYTE) 420 g solution Take 4,000 mLs by mouth as directed. 4000 mL 0   prazosin (MINIPRESS) 2 MG capsule Take 1 capsule (2 mg total) by mouth at bedtime. 30 capsule 1   promethazine (PHENERGAN) 25 MG tablet Take 1 tablet (25 mg total) by mouth every 6 (six) hours as needed for nausea or vomiting. 30 tablet 0   propranolol ER (INDERAL LA) 60 MG 24 hr capsule Take 1 capsule (60 mg total) by mouth daily. 30 capsule 2   SUMAtriptan (IMITREX) 100 MG tablet Take 1 tablet (100 mg total) by mouth once as needed for migraine. May repeat in 2 hours if headache persists or recurs. 10 tablet 2   vitamin B-12 (CYANOCOBALAMIN) 1000 MCG tablet Take 500 mcg by mouth daily.     Vitamin D,  Ergocalciferol, (DRISDOL) 50000 units CAPS capsule TAKE 1 CAPSULE BY MOUTH TWICE A WEEK 16 capsule 0   No current facility-administered medications for this visit.      Psychiatric Specialty Exam: Review of Systems  Cardiovascular:  Negative for chest pain.  Skin:  Negative for rash.  Psychiatric/Behavioral:  Negative for agitation, hallucinations and self-injury.    There were no vitals taken for this visit.There is no height or weight on file to calculate BMI.  General Appearance: Casual  Eye Contact:  Fair  Speech:  Clear and Coherent  Volume:  Normal  Mood:   somewhat subdued  Affect:  Congruent  Thought Process:  Goal Directed  Orientation:  Full (Time, Place, and Person)  Thought Content:  Rumination  Suicidal Thoughts:  No  Homicidal Thoughts:  No  Memory:  Immediate;   Fair Recent;   Fair  Judgement:  Fair  Insight:  Fair  Psychomotor Activity:  Decreased  Concentration:  Concentration: Fair and Attention Span: Fair  Recall:  [employer] of Knowledge:Good  Language: Good  Akathisia:  No  Handed:   AIMS (if indicated):  not done  Assets:  Desire for Improvement Social Support  ADL's:  Intact  Cognition: WNL  Sleep:  Fair to variable decreased   Screenings: Mini-Mental    Flowsheet Row Office Visit from 11/13/2016 in [facility]  Total Score (max 30 points ) 15      PHQ2-9    Flowsheet Row Counselor from 08/22/2020 in [facility] [place] Video Visit from 06/30/2020 in [facility] [place] Office Visit from 11/13/2016 in [facility] Office Visit from 11/06/2016 in [facility] Office Visit from 10/04/2016 in [facility]  PHQ-2 Total Score 2 2 6 6  0  PHQ-9 Total Score 16 8 24 24  --  Flowsheet Row Video Visit from 01/02/2021 in [facility] [place] Video Visit from 10/10/2020 in  [facility] [place] Counselor from 08/22/2020 in [facility] [place]  C-SSRS RISK CATEGORY No Risk No Risk No Risk       Assessment and Plan: as follows Prior documentation reviewed   Mood disorder possible bipolar disorder, depressed: partial remission:  Remains manageable on Rexulti continue and also limit pills with no reported side effects  GAD: Worries over there continue Cymbalta discussed not to take Xanax extra work in therapy discussed and provided supportive therapy about her son leaving for college  PTSD: She does have nightmares Minipress does help continue work on therapy and distraction  Follow-up in 3 months or earlier if needed  Thresa Ross, MD 9/12/20221:14 PM

## 2021-01-05 ENCOUNTER — Ambulatory Visit (INDEPENDENT_AMBULATORY_CARE_PROVIDER_SITE_OTHER): Payer: Medicare Other | Admitting: Licensed Clinical Social Worker

## 2021-01-05 DIAGNOSIS — F4310 Post-traumatic stress disorder, unspecified: Secondary | ICD-10-CM | POA: Diagnosis not present

## 2021-01-05 DIAGNOSIS — F321 Major depressive disorder, single episode, moderate: Secondary | ICD-10-CM

## 2021-01-05 DIAGNOSIS — F0630 Mood disorder due to known physiological condition, unspecified: Secondary | ICD-10-CM | POA: Diagnosis not present

## 2021-01-05 DIAGNOSIS — F411 Generalized anxiety disorder: Secondary | ICD-10-CM | POA: Diagnosis not present
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
## 2021-01-11 ENCOUNTER — Ambulatory Visit (HOSPITAL_COMMUNITY): Payer: Medicare Other | Admitting: Licensed Clinical Social Worker

## 2021-01-20 NOTE — Progress Notes (Deleted)
NEUROLOGY CONSULTATION NOTE  [name] MRN: [id number] DOB: 1966-02-28  Referring provider: Dolores Frame, NP Primary care provider: Dolores Frame, NP  Reason for consult:  migraines  Assessment/Plan:   ***   Subjective:  Jessica Vaughn is a 55 year old female with COPD, sleep apnea, arthritis (RA, cervical DDD), fibromyalgia on chronic opioid management, hypothyroidism, asthma, depression/anxiety and history of seizures who presents for migraine.  History supplemented by referring provider's note.  Onset:  46 or 55 years old.  Worse since 2018 Location:  *** Quality:  throbbing, pressure,aching Intensity:  10/10.  She denies new headache, thunderclap headache or severe headache that wakes her from sleep. Aura:  absent Prodrome:  absent Associated symptoms:  Nausea, photophobia, phonophobia, osmophobia.  She denies associated unilateral numbness or weakness. Duration:  1 day Frequency:  2 days a week Frequency of abortive medication: *** Triggers:  emotional stress, sleep deprivation, fatigue, hunger, exertion, weather changes, coughing Relieving factors:  ice pack Activity:  ***  Prior Work-Up: 11/19/2016 MRI C-SPINE WO (personally reviewed):  multilevel degenerative changes with severe facet uncovertebral hypertrophy causing moderate right neuroforaminal stenosis at C2-C3.  Mild bilateral neuroforaminal stenosis at C3-C4.  No high-grade spinal canal stenosis.  In 2018, she endorsed memory problems and blurred vision.  Her optometrist reportedly noted papilledema.  MRI of brain with and without contrast on 05/01/2017 personally reviewed was normal.  MRV of brain without contrast on 05/01/2017 personally reviewed was normal.  Lumbar puncture on 05/03/2017 demonstrated opening pressure of 6 cm H2O.  Therefore, I had no explanation for optometrist's findings.  She also had neuropsychological evaluation on 06/11/2017 in which she did not provide optimal effort but believed her  symptoms likely secondary to major depression and somatic symptom disorder rather than an organic cognitive impairment.   Current NSAIDS/analgesics:  ibuprofen 800mg , naproxen 500mg , hydrocodone-acetaminophen (chronic pain) Current triptans:  sumatriptan 100mg  tab Current ergotamine:  *** Current anti-emetic:  Zofran 4mg  Current muscle relaxants:  cyclobenzaprine 10mg  QHS PRN Current Antihypertensive medications:  metoprolol, furosemide Current Antidepressant medications:  duloxetine 40mg  BID, bupropion HCl ER 150mg  BID, Rexulti Current Anticonvulsant medications:  lamotrigine 200mg  BID, pregablin 75mg  TID Current anti-CGRP:  *** Current Vitamins/Herbal/Supplements:  KCl, folic acid Current Antihistamines/Decongestants:  none Other therapy:  *** Hormone/birth control:  none Other medications:  ***  Past NSAIDS/analgesics:  tramadol, Excedrin Past abortive triptans:  rizatriptan Past abortive ergotamine:  *** Past muscle relaxants:  *** Past anti-emetic:  *** Past antihypertensive medications:  *** Past antidepressant medications:  sertraline Past anticonvulsant medications:  topiramate Past anti-CGRP:  Nurtec (rescue - effective but denied by insurance), Aimovig Past vitamins/Herbal/Supplements:  Mg, CoQ10, B12 Past antihistamines/decongestants:  *** Other past therapies:  ***  Caffeine:  1/2 cup coffee daily Alcohol:  no Smoker:  sometimes Diet:  Hydrates.  Salads.  No fried foods or sweets. Exercise:  5 miles a week (limited due to pain) Depression/anxiety:  yes Sleep hygiene:  Poor.  Pain keeps her up at night. Family history of headache:  Unknown.      PAST MEDICAL HISTORY: Past Medical History:  Diagnosis Date   Anxiety    Arthritis    COPD (chronic obstructive pulmonary disease) (HCC)    Depression    Neuromuscular disorder (HCC)    Osteoporosis    Seizures (HCC)    Sleep apnea    Ulcer     PAST SURGICAL HISTORY: Past Surgical History:  Procedure  Laterality Date   ROTATOR CUFF REPAIR Left 2009  MEDICATIONS: Current Outpatient Medications on File Prior to Visit  Medication Sig Dispense Refill   albuterol (PROVENTIL HFA;VENTOLIN HFA) 108 (90 Base) MCG/ACT inhaler Inhale 1 puff into the lungs every 6 (six) hours as needed for wheezing or shortness of breath. 1 Inhaler 6   ALPRAZolam (XANAX) 0.5 MG tablet Take 1 tablet (0.5 mg total) by mouth at bedtime as needed. 30 tablet 1   AMITIZA 24 MCG capsule TAKE 1 CAPSULE BY MOUTH TWICE DAILY WITH MEALS 60 capsule 5   atorvastatin (LIPITOR) 40 MG tablet Take 1 tablet (40 mg total) by mouth daily. 90 tablet 3   baclofen (LIORESAL) 10 MG tablet Take 10 mg by mouth 3 (three) times daily.     brexpiprazole (REXULTI) 1 MG TABS tablet Take 1.5 tablets (1.5 mg total) by mouth at bedtime. 30 tablet 1   buPROPion (WELLBUTRIN SR) 150 MG 12 hr tablet SMARTSIG:1 Tablet(s) By Mouth Every 12 Hours 30 tablet 1   cholecalciferol (VITAMIN D) 1000 units tablet Take 1 tablet (1,000 Units total) by mouth daily. 30 tablet 5   DULoxetine (CYMBALTA) 60 MG capsule Take 1 capsule (60 mg total) by mouth 2 (two) times daily. 60 capsule 1   eletriptan (RELPAX) 40 MG tablet TAKE ONE TABLET BY MOUTH AT EARLIEST ONSET OF MIGRAINE, MAY REPEAT ONCE IN 2 HOURS IF HEADACHE PERSISTS OR RECURS 9 tablet 3   Fluticasone-Salmeterol (ADVAIR) 250-50 MCG/DOSE AEPB Inhale 1 puff into the lungs 2 (two) times daily. 60 each 5   folic acid (FOLVITE) 1 MG tablet Take 1 tablet (1 mg total) by mouth daily. 30 tablet 5   furosemide (LASIX) 40 MG tablet Take 1 tablet (40 mg total) by mouth daily. 30 tablet 5   HYDROcodone-acetaminophen (NORCO/VICODIN) 5-325 MG tablet Take by mouth.     hydrocortisone (ANUSOL-HC) 2.5 % rectal cream Place 1 application rectally 2 (two) times daily. 30 g 1   ibuprofen (ADVIL,MOTRIN) 800 MG tablet TAKE 1 TABLET BY MOUTH EVERY 8 HOURS AS NEEDED 90 tablet 2   ibuprofen (ADVIL,MOTRIN) 800 MG tablet Take 1 tablet (800  mg total) by mouth every 8 (eight) hours as needed. 90 tablet 3   lamoTRIgine (LAMICTAL) 200 MG tablet Take 1 tablet (200 mg total) by mouth 2 (two) times daily. 60 tablet 1   metFORMIN (GLUCOPHAGE) 500 MG tablet Take 1 tablet (500 mg total) by mouth 2 (two) times daily with a meal. 60 tablet 1   methocarbamol (ROBAXIN) 500 MG tablet Take 1 tablet (500 mg total) by mouth every 8 (eight) hours as needed for muscle spasms. 30 tablet 1   methotrexate 2.5 MG tablet TAKE 8 TABLETS BY MOUTH ONCE A WEEK 32 tablet 5   naproxen (NAPROSYN) 500 MG tablet Take one with every sumatriptan dose 60 tablet 1   pantoprazole (PROTONIX) 40 MG tablet TAKE 1 TABLET BY MOUTH ONCE DAILY 30 tablet 5   polyethylene glycol-electrolytes (TRILYTE) 420 g solution Take 4,000 mLs by mouth as directed. 4000 mL 0   prazosin (MINIPRESS) 2 MG capsule Take 1 capsule (2 mg total) by mouth at bedtime. 30 capsule 1   promethazine (PHENERGAN) 25 MG tablet Take 1 tablet (25 mg total) by mouth every 6 (six) hours as needed for nausea or vomiting. 30 tablet 0   propranolol ER (INDERAL LA) 60 MG 24 hr capsule Take 1 capsule (60 mg total) by mouth daily. 30 capsule 2   SUMAtriptan (IMITREX) 100 MG tablet Take 1 tablet (100 mg total) by mouth  once as needed for migraine. May repeat in 2 hours if headache persists or recurs. 10 tablet 2   vitamin B-12 (CYANOCOBALAMIN) 1000 MCG tablet Take 500 mcg by mouth daily.     Vitamin D, Ergocalciferol, (DRISDOL) 50000 units CAPS capsule TAKE 1 CAPSULE BY MOUTH TWICE A WEEK 16 capsule 0   [DISCONTINUED] pregabalin (LYRICA) 75 MG capsule Take 1 capsule (75 mg total) by mouth 3 (three) times daily. 90 capsule 1   No current facility-administered medications on file prior to visit.    ALLERGIES: Allergies  Allergen Reactions   Meloxicam Other (See Comments)    Interacts with other meds   Tramadol Other (See Comments)    Interacts with other meds   Robaxin [Methocarbamol]    Zanaflex [Tizanidine Hcl]      FAMILY HISTORY: Family History  Problem Relation Age of Onset   Depression Mother    Cancer Father    Diabetes Father    Hypertension Father    Drug abuse Sister    Colon cancer Neg Hx     Objective:  *** General: No acute distress.  Patient appears well-groomed.   Head:  Normocephalic/atraumatic Eyes:  fundi examined but not visualized Neck: supple, no paraspinal tenderness, full range of motion Back: No paraspinal tenderness Heart: regular rate and rhythm Lungs: Clear to auscultation bilaterally. Vascular: No carotid bruits. Neurological Exam: Mental status: alert and oriented to person, place, and time, recent and remote memory intact, fund of knowledge intact, attention and concentration intact, speech fluent and not dysarthric, language intact. Cranial nerves: CN I: not tested CN II: pupils equal, round and reactive to light, visual fields intact CN III, IV, VI:  full range of motion, no nystagmus, no ptosis CN V: facial sensation intact. CN VII: upper and lower face symmetric CN VIII: hearing intact CN IX, X: gag intact, uvula midline CN XI: sternocleidomastoid and trapezius muscles intact CN XII: tongue midline Bulk & Tone: normal, no fasciculations. Motor:  muscle strength 5/5 throughout Sensation:  Pinprick, temperature and vibratory sensation intact. Deep Tendon Reflexes:  2+ throughout,  toes downgoing.   Finger to nose testing:  Without dysmetria.   Heel to shin:  Without dysmetria.   Gait:  Normal station and stride.  Romberg negative.    Thank you for allowing me to take part in the care of this patient.  Shon Millet, DO  CC: ***

## 2021-01-23 ENCOUNTER — Ambulatory Visit: Payer: Medicaid Other | Admitting: Neurology

## 2021-01-24 ENCOUNTER — Telehealth (HOSPITAL_COMMUNITY): Payer: Self-pay

## 2021-01-24 DIAGNOSIS — F321 Major depressive disorder, single episode, moderate: Secondary | ICD-10-CM

## 2021-01-24 MED ORDER — LAMOTRIGINE 200 MG PO TABS: 200.0000 mg | ORAL_TABLET | Freq: Two times a day (BID) | ORAL | 0 refills | Status: DC

## 2021-01-24 NOTE — Telephone Encounter (Signed)
A new Lamictal 200 mg, one 2 times daily, #180 with no refills e-scribed to patient's [facility] as authorized by Dr. Gilmore Laroche.

## 2021-01-24 NOTE — Telephone Encounter (Signed)
Medication management - Fax from pt's [facility] pharmacy with request provider send in a 90 day supply of pt's prescribed Lamotrigine 200 mg, 1 tablet twice a day as cost for a 30 day and 90 day supply are the same for patient.

## 2021-02-08 ENCOUNTER — Telehealth (HOSPITAL_COMMUNITY): Payer: Self-pay | Admitting: Psychiatry

## 2021-02-08 ENCOUNTER — Other Ambulatory Visit (HOSPITAL_COMMUNITY): Payer: Self-pay | Admitting: Psychiatry

## 2021-02-08 NOTE — Telephone Encounter (Signed)
  Pt needs refill ALPRAZolam (XANAX) 0.5 MG tablet   Send to [facility] [address] [place] - [address]

## 2021-02-16 ENCOUNTER — Ambulatory Visit (INDEPENDENT_AMBULATORY_CARE_PROVIDER_SITE_OTHER): Payer: Medicare Other | Admitting: Licensed Clinical Social Worker

## 2021-02-16 DIAGNOSIS — F321 Major depressive disorder, single episode, moderate: Secondary | ICD-10-CM | POA: Diagnosis not present

## 2021-02-16 DIAGNOSIS — F0630 Mood disorder due to known physiological condition, unspecified: Secondary | ICD-10-CM

## 2021-02-16 DIAGNOSIS — F4310 Post-traumatic stress disorder, unspecified: Secondary | ICD-10-CM

## 2021-02-16 DIAGNOSIS — F411 Generalized anxiety disorder: Secondary | ICD-10-CM
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
## 2021-03-09 ENCOUNTER — Ambulatory Visit (HOSPITAL_COMMUNITY): Payer: Medicare Other | Admitting: Licensed Clinical Social Worker

## 2021-03-09 NOTE — Progress Notes (Addendum)
Therapist contacted patient by email for session and she did not respond session is a no show.  Therapist learned patient got on late with sleeping because of medications and has the flu so therapist changed no-show as patient had the flu

## 2021-03-10 ENCOUNTER — Other Ambulatory Visit (HOSPITAL_COMMUNITY): Payer: Self-pay | Admitting: Psychiatry

## 2021-04-03 ENCOUNTER — Ambulatory Visit (HOSPITAL_COMMUNITY): Payer: Medicare Other | Admitting: Licensed Clinical Social Worker

## 2021-04-11 ENCOUNTER — Telehealth (HOSPITAL_COMMUNITY): Payer: Medicare Other | Admitting: Psychiatry

## 2021-04-13 ENCOUNTER — Ambulatory Visit (INDEPENDENT_AMBULATORY_CARE_PROVIDER_SITE_OTHER): Payer: Medicare Other | Admitting: Licensed Clinical Social Worker

## 2021-04-13 DIAGNOSIS — F4310 Post-traumatic stress disorder, unspecified: Secondary | ICD-10-CM

## 2021-04-13 DIAGNOSIS — F321 Major depressive disorder, single episode, moderate: Secondary | ICD-10-CM

## 2021-04-13 DIAGNOSIS — F411 Generalized anxiety disorder: Secondary | ICD-10-CM | POA: Diagnosis not present

## 2021-04-13 DIAGNOSIS — F0630 Mood disorder due to known physiological condition, unspecified: Secondary | ICD-10-CM | POA: Diagnosis not present

## 2021-04-13 NOTE — Progress Notes (Signed)
Virtual Visit via Video Note  I connected with Jessica Vaughn on 04/13/21 at  1:00 PM EST by a video enabled telemedicine application and verified that I am speaking with the correct person using two identifiers.  Location: Patient: home Provider: home office   I discussed the limitations of evaluation and management by telemedicine and the availability of in person appointments. The patient expressed understanding and agreed to proceed.   I discussed the assessment and treatment plan with the patient. The patient was provided an opportunity to ask questions and all were answered. The patient agreed with the plan and demonstrated an understanding of the instructions.   The patient was advised to call back or seek an in-person evaluation if the symptoms worsen or if the condition fails to improve as anticipated.  I provided 53 minutes of non-face-to-face time during this encounter.  THERAPIST PROGRESS NOTE  Session Time: 1:00 PM to 1:53 PM  Participation Level: Active  Behavioral Response: CasualAlertIrritable and patient describes in general tired, wanting to disconnect and be at home  Type of Therapy: Individual Therapy  Treatment Goals addressed:  TSD, processed through feelings related to unresolved issues she has  with friend who has passed, anxiety, depression, coping Interventions: Solution Focused, Strength-based, Supportive, and Other: Coping  Summary: Jessica Vaughn is a 55 y.o. female who presents with last session had the flu. Out of it didn't sleep well, stuffed nose, glands swollen, sinus infection, chills, achy, two days in bed. Son was home five days he had it. 108 out of school. Talked about billing and getting more money out of it by medical providers and frustration of that and therapist agreeing.. With Christmas in the mood and not it is mixed. OCD kicked in didn't sleep for 24 hours, clean nonstop, aggravated with everything move two stops forward and then kicked  four steps back. OCD really bad. Have a lot on mind and mind couldn't rest. Therapist encouraged her to talk about it and patient did.  Noted limited Income and Welfare stopped that check he turned 18. With housing they sent money for utilities $281 cut down to $17 utilities. Also because extra getting extra $100 for disability the patient points out only gets $914 month.  Therapist noted not really getting more money as her taking away from other places and patient agreed also means patient able to make ends meet with so little money. Oldest son has a lot going on has to pay for [facility]. Not rich trying to make it work. Tried of making things work following the rules of the Social worker. Why do this only screwed the ones who take advantage get everything. Good thing put on [employer] as well as other insurance can get eye glasses, hearing aid don't have to pay premium. Give a visa card and put over $225 on visa. Going to work one way or the other. Left house three times since last talked to therapist. Don't want to go anywhere can't be bothered. Tired of it. Sick of everything at the moment has been going on for awhile. Doesn't want to get dressed. Doesn't mind not going now kids of age relief doesn't have to get it they can her medications. Kids want her to go out but don't want to bothered want to be left alone. Because so stressed out, stress level worse, has red welts from stress, itching. Hydroxyzine goes away a lot lately.  Talked about another situation bothered her when the major got her son a ticket for  amusement park after they agreed he did not want to go. She felt disrespected when got a ticket and didn't run by patient shared how she felt that she didn't matter. Son 20 but doesn't pay any bills. Not a charity case. Upset text that he showed major that wrote saying she was parent not major. Upset because that is between her and son. What happened happens between them within family not interfering in their  business. Felt embarrassed and humiliated. Felt son should have responded-when not going to go accepted it and leave it alone. All authority taken away so also angry. Felt major made a decision and no say. He caused a big conflict and didn't like it. Got under her skin for the longest time. Aggravated parent that he moved in on family and who in charge that he took over and change what she said, what she wants for her son. Says her son let this happen, treat her like crap has her dignity as a parent who is responsible it was like she doesn't matter. Put real Christmas tree decorating in and out, baking. It's ok tried not sleeping well. Son has a girlfriend said don't throw away for any girl high school crushes can't give away dream and stay here. At that age not high chance it is going to work, she is not giving up anything look at that picture. Get grounded and get to a place know take care of yourself, make your future. Stability in life and be alright. Don't compromise on future. To a point tried, don't give a crap, watch TV peace and quiet. Don't want drama. When on own since 62 and 55 feel like 100 years exhausted physically, emotionally mentally. Don't want to be bothered.  Therapist noted her concerns but patient just rather be doing what she is doing where she is at and what she wants.   Therapist reviewed symptoms, facilitated expression of thoughts and feeling and important treatment intervention as patient showed frustration in session also explain a lot of things on her mind reason for escalation of OCD able to talk about them with therapist.  Therapist validated patient on how she was feeling noting her authority being undermined understandable to feel frustration also not to have family business go out side family understanding that as well.  Therapist utilize reframing to note positives despite her frustration doing the nice things for her son, making Christmas special for them.  Therapist explored  patient's feelings related to not wanting to be bothered not wanting to engage with other people, exploring underlying source for this concern how it will not impact mood.  Note it is patient's choice and happy with that decision despite feeling tired where she wants to be more isolated when she is feeling like this.  Therapist pointed out doing some simple positive things may help with mood.  Patient provided more details of why she gets frustrated with people and with these details therapist understood more how people will gossip bring unnecessary drama with these details understand more why patient can feel wanting to be away from that.  Note patient still engaged with therapy and note this is positive and with therapist.  Therapist provided active listening open questions, supportive interventions Suicidal/Homicidal: No  Plan: Return again in 2 weeks.2.  Work on stressors, frustrations intervention to help mood  Diagnosis: Axis I:  major depressive disorder, moderate, recurrent, generalized anxiety disorder, PTSD, mood disorder in conditions classified elsewhere    Axis II: No diagnosis  Coolidge Breeze, LCSW 04/13/2021

## 2021-04-20 ENCOUNTER — Telehealth (INDEPENDENT_AMBULATORY_CARE_PROVIDER_SITE_OTHER): Payer: Medicare Other | Admitting: Psychiatry

## 2021-04-20 ENCOUNTER — Encounter (HOSPITAL_COMMUNITY): Payer: Self-pay | Admitting: Psychiatry

## 2021-04-20 DIAGNOSIS — F411 Generalized anxiety disorder: Secondary | ICD-10-CM | POA: Diagnosis not present

## 2021-04-20 DIAGNOSIS — M797 Fibromyalgia: Secondary | ICD-10-CM | POA: Diagnosis not present

## 2021-04-20 DIAGNOSIS — F321 Major depressive disorder, single episode, moderate: Secondary | ICD-10-CM | POA: Diagnosis not present

## 2021-04-20 MED ORDER — DULOXETINE HCL 60 MG PO CPEP: 60.0000 mg | ORAL_CAPSULE | Freq: Two times a day (BID) | ORAL | 1 refills | Status: DC

## 2021-04-20 MED ORDER — BUPROPION HCL ER (SR) 150 MG PO TB12: ORAL_TABLET | ORAL | 1 refills | Status: DC

## 2021-04-20 MED ORDER — ALPRAZOLAM 0.5 MG PO TABS: 0.5000 mg | ORAL_TABLET | Freq: Every evening | ORAL | 0 refills | Status: DC | PRN

## 2021-04-20 NOTE — Progress Notes (Signed)
[facility] Follow up visit  Patient Identification: Jessica Vaughn MRN:  [id number] Date of Evaluation:  04/20/2021 Referral Source: [facility] Chief Complaint:  follow up depression, irregular sleep Visit Diagnosis:    ICD-10-CM   1. Current moderate episode of major depressive disorder without prior episode (HCC)  F32.1     2. GAD (generalized anxiety disorder)  F41.1     3. Fibromyalgia  M79.7      Virtual Visit via Video Note  I connected with Jessica Vaughn on 04/20/21 at  4:00 PM EST by a video enabled telemedicine application and verified that I am speaking with the correct person using two identifiers.  Location: Patient: home Provider: home office   I discussed the limitations of evaluation and management by telemedicine and the availability of in person appointments. The patient expressed understanding and agreed to proceed.      I discussed the assessment and treatment plan with the patient. The patient was provided an opportunity to ask questions and all were answered. The patient agreed with the plan and demonstrated an understanding of the instructions.   The patient was advised to call back or seek an in-person evaluation if the symptoms worsen or if the condition fails to improve as anticipated.  I provided 15 minutes of non-face-to-face time during this encounter.        History of Present Illness: Patient is a 55 years old single Caucasian female initially  referred by pain clinic for management and to establish care for depression and possible bipolar.  she suffers from chronic pain rheumatoid arthritis   Arthritis effects sleep and mood  Sons are doing good, one joining [employer] Overall meds help and she keeps busy but sleep can become irregular She takes pain med during the day On xanax and understands not to take more then prescribed Helps anxiety    Therapy is helping  Aggravating factors; co morbid medical conditions, fibromyalgia  past trauma, worries related to her son Modifying factors; boys  Duration since young age   Past Psychiatric History: depression, anxiety   Previous Psychotropic Medications: Yes     Past Medical History:  Past Medical History:  Diagnosis Date   Anxiety    Arthritis    COPD (chronic obstructive pulmonary disease) (HCC)    Depression    Neuromuscular disorder (HCC)    Osteoporosis    Seizures (HCC)    Sleep apnea    Ulcer     Past Surgical History:  Procedure Laterality Date   ROTATOR CUFF REPAIR Left 2009    Family Psychiatric History: mom : depression  Family History:  Family History  Problem Relation Age of Onset   Depression Mother    Cancer Father    Diabetes Father    Hypertension Father    Drug abuse Sister    Colon cancer Neg Hx     Social History:   Social History   Socioeconomic History   Marital status: Divorced    Spouse name: Not on file   Number of children: 2   Years of education: Not on file   Highest education level: Not on file  Occupational History   Not on file  Tobacco Use   Smoking status: Every Day    Packs/day: 0.50    Types: Cigarettes   Smokeless tobacco: Never  Vaping Use   Vaping Use: Never used  Substance and Sexual Activity   Alcohol use: No   Drug use: No   Sexual activity: Yes  Birth control/protection: Post-menopausal  Other Topics Concern   Not on file  Social History Narrative   Lives with 2 sons, 1 is disabled. Moved her recently for [place].    Social Determinants of Health   Financial Resource Strain: Not on file  Food Insecurity: Not on file  Transportation Needs: Not on file  Physical Activity: Not on file  Stress: Not on file  Social Connections: Not on file      Allergies:   Allergies  Allergen Reactions   Meloxicam Other (See Comments)    Interacts with other meds   Tramadol Other (See Comments)    Interacts with other meds   Robaxin [Methocarbamol]    Zanaflex [Tizanidine Hcl]      Metabolic Disorder Labs: Lab Results  Component Value Date   HGBA1C 5.5 11/06/2016   No results found for: PROLACTIN Lab Results  Component Value Date   CHOL 238 (H) 11/06/2016   TRIG 144 11/06/2016   HDL 52 11/06/2016   CHOLHDL 4.6 (H) 11/06/2016   LDLCALC 157 (H) 11/06/2016   Lab Results  Component Value Date   TSH 1.740 11/13/2016    Therapeutic Level Labs: No results found for: LITHIUM No results found for: CBMZ No results found for: VALPROATE  Current Medications: Current Outpatient Medications  Medication Sig Dispense Refill   albuterol (PROVENTIL HFA;VENTOLIN HFA) 108 (90 Base) MCG/ACT inhaler Inhale 1 puff into the lungs every 6 (six) hours as needed for wheezing or shortness of breath. 1 Inhaler 6   ALPRAZolam (XANAX) 0.5 MG tablet Take 1 tablet (0.5 mg total) by mouth at bedtime as needed. 30 tablet 0   AMITIZA 24 MCG capsule TAKE 1 CAPSULE BY MOUTH TWICE DAILY WITH MEALS 60 capsule 5   atorvastatin (LIPITOR) 40 MG tablet Take 1 tablet (40 mg total) by mouth daily. 90 tablet 3   baclofen (LIORESAL) 10 MG tablet Take 10 mg by mouth 3 (three) times daily.     brexpiprazole (REXULTI) 1 MG TABS tablet Take 1.5 tablets (1.5 mg total) by mouth at bedtime. 30 tablet 1   buPROPion (WELLBUTRIN SR) 150 MG 12 hr tablet SMARTSIG:1 Tablet(s) By Mouth Every 12 Hours 30 tablet 1   cholecalciferol (VITAMIN D) 1000 units tablet Take 1 tablet (1,000 Units total) by mouth daily. 30 tablet 5   DULoxetine (CYMBALTA) 60 MG capsule Take 1 capsule (60 mg total) by mouth 2 (two) times daily. 60 capsule 1   eletriptan (RELPAX) 40 MG tablet TAKE ONE TABLET BY MOUTH AT EARLIEST ONSET OF MIGRAINE, MAY REPEAT ONCE IN 2 HOURS IF HEADACHE PERSISTS OR RECURS 9 tablet 3   Fluticasone-Salmeterol (ADVAIR) 250-50 MCG/DOSE AEPB Inhale 1 puff into the lungs 2 (two) times daily. 60 each 5   folic acid (FOLVITE) 1 MG tablet Take 1 tablet (1 mg total) by mouth daily. 30 tablet 5   furosemide (LASIX) 40  MG tablet Take 1 tablet (40 mg total) by mouth daily. 30 tablet 5   HYDROcodone-acetaminophen (NORCO/VICODIN) 5-325 MG tablet Take by mouth.     hydrocortisone (ANUSOL-HC) 2.5 % rectal cream Place 1 application rectally 2 (two) times daily. 30 g 1   ibuprofen (ADVIL,MOTRIN) 800 MG tablet TAKE 1 TABLET BY MOUTH EVERY 8 HOURS AS NEEDED 90 tablet 2   ibuprofen (ADVIL,MOTRIN) 800 MG tablet Take 1 tablet (800 mg total) by mouth every 8 (eight) hours as needed. 90 tablet 3   lamoTRIgine (LAMICTAL) 200 MG tablet Take 1 tablet (200 mg total) by mouth  2 (two) times daily. 180 tablet 0   metFORMIN (GLUCOPHAGE) 500 MG tablet Take 1 tablet (500 mg total) by mouth 2 (two) times daily with a meal. 60 tablet 1   methocarbamol (ROBAXIN) 500 MG tablet Take 1 tablet (500 mg total) by mouth every 8 (eight) hours as needed for muscle spasms. 30 tablet 1   methotrexate 2.5 MG tablet TAKE 8 TABLETS BY MOUTH ONCE A WEEK 32 tablet 5   naproxen (NAPROSYN) 500 MG tablet Take one with every sumatriptan dose 60 tablet 1   pantoprazole (PROTONIX) 40 MG tablet TAKE 1 TABLET BY MOUTH ONCE DAILY 30 tablet 5   polyethylene glycol-electrolytes (TRILYTE) 420 g solution Take 4,000 mLs by mouth as directed. 4000 mL 0   prazosin (MINIPRESS) 2 MG capsule Take 1 capsule (2 mg total) by mouth at bedtime. 30 capsule 1   promethazine (PHENERGAN) 25 MG tablet Take 1 tablet (25 mg total) by mouth every 6 (six) hours as needed for nausea or vomiting. 30 tablet 0   propranolol ER (INDERAL LA) 60 MG 24 hr capsule Take 1 capsule (60 mg total) by mouth daily. 30 capsule 2   SUMAtriptan (IMITREX) 100 MG tablet Take 1 tablet (100 mg total) by mouth once as needed for migraine. May repeat in 2 hours if headache persists or recurs. 10 tablet 2   vitamin B-12 (CYANOCOBALAMIN) 1000 MCG tablet Take 500 mcg by mouth daily.     Vitamin D, Ergocalciferol, (DRISDOL) 50000 units CAPS capsule TAKE 1 CAPSULE BY MOUTH TWICE A WEEK 16 capsule 0   No current  facility-administered medications for this visit.      Psychiatric Specialty Exam: Review of Systems  Cardiovascular:  Negative for chest pain.  Skin:  Negative for rash.  Psychiatric/Behavioral:  Positive for sleep disturbance. Negative for agitation, hallucinations and self-injury.    There were no vitals taken for this visit.There is no height or weight on file to calculate BMI.  General Appearance: Casual  Eye Contact:  Fair  Speech:  Clear and Coherent  Volume:  Normal  Mood: fair  Affect:  Congruent  Thought Process:  Goal Directed  Orientation:  Full (Time, Place, and Person)  Thought Content:  Rumination  Suicidal Thoughts:  No  Homicidal Thoughts:  No  Memory:  Immediate;   Fair Recent;   Fair  Judgement:  Fair  Insight:  Fair  Psychomotor Activity:  Decreased  Concentration:  Concentration: Fair and Attention Span: Fair  Recall:  [employer] of Knowledge:Good  Language: Good  Akathisia:  No  Handed:   AIMS (if indicated):  not done  Assets:  Desire for Improvement Social Support  ADL's:  Intact  Cognition: WNL  Sleep:  Fair to variable decreased   Screenings: Mini-Mental    Flowsheet Row Office Visit from 11/13/2016 in [facility]  Total Score (max 30 points ) 15      PHQ2-9    Flowsheet Row Counselor from 08/22/2020 in [facility] [place] Video Visit from 06/30/2020 in [facility] [place] Office Visit from 11/13/2016 in [facility] Office Visit from 11/06/2016 in [facility] Office Visit from 10/04/2016 in [facility]  PHQ-2 Total Score 2 2 6 6  0  PHQ-9 Total Score 16 8 24 24  --      Flowsheet Row Video Visit from 04/20/2021 in [facility] [place] Video Visit from 01/02/2021 in [facility] [place] Video Visit from 10/10/2020 in [facility] AT [place]  C-SSRS RISK CATEGORY No Risk No Risk No Risk       Assessment and Plan: as follows  Prior documentation reviewed    Mood disorder possible bipolar disorder, depressed: partial remission, continue rexulti, cymbalta, lamictal   GAD: fluctuates, worries about her sons, continue therapy, also on cymbalata and xanax  PTSD: manageable on cymbalta, minipress, reviewed sleep hygiene  No rash or side effects  Fu 18m.  Thresa Ross, MD 12/29/20224:19 PM

## 2021-04-25 ENCOUNTER — Encounter (HOSPITAL_COMMUNITY): Payer: Self-pay

## 2021-04-25 ENCOUNTER — Ambulatory Visit (HOSPITAL_COMMUNITY): Payer: Medicare HMO | Admitting: Licensed Clinical Social Worker

## 2021-05-01 ENCOUNTER — Ambulatory Visit: Payer: Medicare Other | Admitting: Neurology

## 2021-05-02 ENCOUNTER — Ambulatory Visit (INDEPENDENT_AMBULATORY_CARE_PROVIDER_SITE_OTHER): Payer: Medicare HMO | Admitting: Licensed Clinical Social Worker

## 2021-05-02 DIAGNOSIS — F411 Generalized anxiety disorder: Secondary | ICD-10-CM | POA: Diagnosis not present

## 2021-05-02 DIAGNOSIS — F4310 Post-traumatic stress disorder, unspecified: Secondary | ICD-10-CM

## 2021-05-02 DIAGNOSIS — F0630 Mood disorder due to known physiological condition, unspecified: Secondary | ICD-10-CM | POA: Diagnosis not present

## 2021-05-02 DIAGNOSIS — F321 Major depressive disorder, single episode, moderate: Secondary | ICD-10-CM

## 2021-05-02 NOTE — Progress Notes (Signed)
Virtual Visit via Video Note  I connected with Jessica Vaughn on 05/02/21 at 11:00 AM EST by a video enabled telemedicine application and verified that I am speaking with the correct person using two identifiers.  Location: Patient: home Provider: office   I discussed the limitations of evaluation and management by telemedicine and the availability of in person appointments. The patient expressed understanding and agreed to proceed.   I discussed the assessment and treatment plan with the patient. The patient was provided an opportunity to ask questions and all were answered. The patient agreed with the plan and demonstrated an understanding of the instructions.   The patient was advised to call back or seek an in-person evaluation if the symptoms worsen or if the condition fails to improve as anticipated.  I provided 25 minutes of non-face-to-face time during this encounter.  THERAPIST PROGRESS NOTE  Session Time: 11:00 AM to 11:25 AM  Participation Level: Active  Behavioral Response: CasualAlertDysphoric and Irritable  Type of Therapy: Individual Therapy  Treatment Goals addressed:  PTSD, processed through feelings related to unresolved issues she has  with friend who has passed, anxiety, depression, coping Interventions: Solution Focused, Play Therapy, Supportive, and Other: coping  Summary: Jessica Vaughn is a 56 y.o. female who presents with pointing out therapist wants her to get out more but then talked about going to [employer] last week was frustrated with people who were rude. Tries to hold back but when rude has to say something. She says she is tired of  the way feeling in a lot of pain. Going to see RA doctor in a little bit. Tell him "medications not doing anything find something that helps or hell with it". Doesn't want to fill her body with medications not working. Takes pain meds in morning or night but doesn't want to take four times a day. Those do help. She says  there is no cure for RA or fibromyalgia but give her something that gives relief not sit on edge of bed and think to herself how am I going to get myself  frustrated and tired of it. Has to have knee replacement surgery and says "that's fun".  Therapist shared her perspective that patient do not get relief from doctors and come to mental health session with stress, patient's response was to advocate for doctors are trying their best maybe try something else body used to it.  Patient talked about her dogs and shares [place] love, 7 years Hachi-gets into everything gets on her nerves.  Therapist lightened mood by saying even dogs get on her nerves and patient said she even gets on her own nerves.  Sent in a lighthearted way although some truthfulness to it  Therapist reviewed symptoms, facilitated expression of thoughts and feelings reviewed treatment plan and identified having an unbiased outlet outside family is one of the most helpful parts of therapy.  Noted patient's frustration with people therapist validated her help can be frustrating in public with other people and their lack of thoughtfulness at the same time continuing to work on patient being more tolerant that would help with the isolation.  Processed feelings related to significant stressors with medical issues.  Did patient's own insight of perhaps getting tolerant to medications so need to try something different, as well as her standing of doctors that they are trying the best they can able to see that despite dealing with her frustrations.  Session shorter as patient had a go to appointment.  Therapist provided active  listening open questions, supportive interventions.  Therapist provided support and space to patient and she shared her thoughts and feelings in session and note minimal progress on goals and assess therapy helpful for patient's coping.  Suicidal/Homicidal: No  Plan: Return again in 2 weeks.2.Work on stressors, frustrations  intervention to help mood  Diagnosis: Axis I: major depressive disorder, moderate, recurrent, generalized anxiety disorder, PTSD, mood disorder in conditions classified elsewhere     Axis II: No diagnosis    Coolidge Breeze, LCSW 05/02/2021

## 2021-05-18 ENCOUNTER — Ambulatory Visit (INDEPENDENT_AMBULATORY_CARE_PROVIDER_SITE_OTHER): Payer: Medicare HMO | Admitting: Licensed Clinical Social Worker

## 2021-05-18 DIAGNOSIS — F4310 Post-traumatic stress disorder, unspecified: Secondary | ICD-10-CM

## 2021-05-18 DIAGNOSIS — F321 Major depressive disorder, single episode, moderate: Secondary | ICD-10-CM

## 2021-05-18 DIAGNOSIS — F411 Generalized anxiety disorder: Secondary | ICD-10-CM

## 2021-05-18 DIAGNOSIS — F0630 Mood disorder due to known physiological condition, unspecified: Secondary | ICD-10-CM | POA: Diagnosis not present

## 2021-05-18 NOTE — Progress Notes (Signed)
Virtual Visit via Video Note  I connected with Jessica Vaughn on 05/18/21 at 11:00 AM EST by a video enabled telemedicine application and verified that I am speaking with the correct person using two identifiers.  Location: Patient: home Provider: home office   I discussed the limitations of evaluation and management by telemedicine and the availability of in person appointments. The patient expressed understanding and agreed to proceed.   I discussed the assessment and treatment plan with the patient. The patient was provided an opportunity to ask questions and all were answered. The patient agreed with the plan and demonstrated an understanding of the instructions.   The patient was advised to call back or seek an in-person evaluation if the symptoms worsen or if the condition fails to improve as anticipated.  I provided 43 minutes of non-face-to-face time during this encounter.  THERAPIST PROGRESS NOTE  Session Time: 11:00 AM to 11:43 AM  Participation Level: Active  Behavioral Response: CasualAlertEuthymic-note mood presents as improved since last few session  Type of Therapy: Individual Therapy  Treatment Goals addressed:  PTSD, processed through feelings related to unresolved issues she has  with friend who has passed, anxiety, depression, coping Interventions: Solution Focused, Strength-based, Supportive, and Other: coping  Summary: Jessica Vaughn is a 56 y.o. female who presents with update on medical issues went to RA doctor and on same medications but increased tests are good, liver a little higher and white blood cells high which is normal for her. Last few months struggling with stomach went to GI gave her something for pancreas has ultrasound on 8th, colonoscopy because there is some bleeding from that area for 6 months and also clots. No worried yet could be anything. What going on nothing much trying to get things done oldest son helping her a lot. Get rid of junk.  Started gardening inside.  Showed therapist plants. trying to get back into that slowly trying to keep busy and exercise more. Has a greenhouse son bought when warm can transfer to that greenhouse. Still in funk try to block it out at times try to not think about it lot. At night though lie awake at night and think about things. Trying to take care of things but has to take breaks have to relax every few minutes too much pain. Almost two weeks ago went for a ride with sons and girlfriend. Took them out for lunch and went shopping. It was a long time since done that it was good but really tired. Feels she is trying a little-some days doesn't feel like it other days puts in effort but then done. Going to play a joke with GI doctor.  Shared to joke with therapist and therapist that was very funny noted its nice to be able to laugh and patient agrees. Crazy with sons. Youngest almost like animosity toward brother but not sure. He is tired the way his brother acts-he has accomplished a lot it might be resentful not sure. Younger son doesn't like the way he talks he presents what he says people take it wrong and offended doesn't mean it that way doesn't word it the right way. Patient tells him to think before say something. Going to send younger son to adopted sister Jessica Quin[place] in March and April good for him to get away and have fun. He feels he is responsible for patient, told him doesn't want that wants him to live his life. Has a home health aid in 5 days for 3 hours. Wants  him to do therapy has Bipolar. Encouraged him to give it a try. Transitioned to patient saying a lot of things came back from childhood doesn't know if blocked out only talk to adopted sister Jessica Quin.  Discussed talking about in therapy as part of process of working on trauma patient said maybe next time or in near future we will talk about with therapist  Therapist reviewed symptoms, facilitated expression of thoughts and feeling noted patient starting  gardening as a positive sign of moving forward with treatment goals at the same time we will also have the effect of helping with mood.  Noted also positive patient went out with her sons to help with mood though in processing with patient is not as beneficial as therapist thinks because she gets tired and likes being at home.  Patient herself noted she is trying indication of making progress of treatment goals also has been working around the house to the best of her ability.  Processed her feelings related to stressors around her sons.  Therapist having same perspective as patient that they need to take care of each other as brothers.  Also felt the same as patient with younger son that he wants to make sure she is doing okay but also he has to make sure to live his own life.  Therapist provided active listening open questions, supportive interventions.  Provided education on trauma how it is treated in therapy session that includes narrating and processing, will utilize sessions as patient needs to to work on trauma  Suicidal/Homicidal: No  Plan: Return again in 3 weeks.2Work on stressors, frustrations intervention to help mood  Diagnosis: Axis I: major depressive disorder, moderate, recurrent, generalized anxiety disorder, PTSD, mood disorder in conditions classified elsewhere    Axis II: No diagnosis    Coolidge Breeze, LCSW 05/18/2021

## 2021-05-24 ENCOUNTER — Telehealth (HOSPITAL_COMMUNITY): Payer: Self-pay | Admitting: Psychiatry

## 2021-05-24 MED ORDER — ALPRAZOLAM 0.5 MG PO TABS
0.5000 mg | ORAL_TABLET | Freq: Every evening | ORAL | 1 refills | Status: DC | PRN
Start: 2021-05-24 — End: 2021-07-13

## 2021-05-24 NOTE — Telephone Encounter (Signed)
REFILL:  ALPRAZolam (XANAX) 0.5 MG tablet   SEND TO:  [facility] [address] - [place], [place] - [address]

## 2021-06-06 NOTE — Progress Notes (Unsigned)
NEUROLOGY CONSULTATION NOTE  Jessica Vaughn MRN: [id number] DOB: [birth year]  Referring provider: *** Primary care provider: ***  Reason for consult:  ***  Assessment/Plan:   ***   Subjective:  Jessica Vaughn is a 56 year old left-handed female with asthma, hyperlipidemia, fibromyalgia, rheumatoid arthritis, and remote history of childhood seizures who presents for migraines.  History supplemented by referring provider's note.  I have seen and treated the patient several years ago, last visit on 01/21/2017.  In summary: Onset:  Since 44 or 56 years old Location:  Bi-frontal and back of neck into shoulders Quality:  throbbing Intensity:  10/10.  She denies new headache, thunderclap headache Aura:  absent Prodrome:  absent Associated symptoms:  nausea, photophobia, phonophobia, osmophobia.  She denies associated unilateral numbness or weakness. Duration:  *** Frequency:  *** Frequency of abortive medication: *** Triggers/aggravating factors:  coughing Relieving factors:  ice packs Activity:  aggravates  When last seen on 01/21/2017, she reported dimming of vision and tinnitus.  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  I ordered MRI of brain with and without contrast and MRV of head performed on 05/01/2017, personally reviewed, which were normal.  She underwent lumbar puncture on 05/03/2017 which revealed an opening CSF pressure of 6 cm H2O.  Of note, she also endorsed on 01/21/2017 short-term memory deficits that had been noticeable for about a year.  Specifically she would forget why she walked into a room, forget to take her medications or forget to eat meals.  She was able to perform ADLs and manage finances and drive without issue.  She was concerned because her father was diagnosed with Alzheimer's at age 66 and passed away at 59.  Labs that year included negative HIV, TSH 1.740, negative RPR, B12 over 2000 and  folate over 20.  She underwent neuropsychological evaluation *** which was of low yield due to suboptimal performance but she demonstrated moderate to severe major depressive disorder, somatic symptom disorder with predominant pain and was not felt to be demonstrating early-onset AD.  Current NSAIDS/analgesics:  ibuprofen , hydrocodone-acetamijnophen Current triptans:  sumatriptan  Current ergotamine:  *** Current anti-emetic:  ondansetron , promethazine  Current muscle relaxants:  cyclobenzaprine  QHS PRN Current Antihypertensive medications:  metoprolol succinate ER  PRN (palpitations), furosemide  daily Current Antidepressant medications:  duloxetine  BID, bupropion HCl ER  BID Current Anticonvulsant medications:  pregablin  TID, lamotrigine  BID Current anti-CGRP:  Nurtec QOD (prevention - not covered by insurance) Current benzodiazepines:  alprazolam 0.5mg  BID PRN Current Vitamins/Herbal/Supplements:  KCl, folic acid Current Antihistamines/Decongestants:  *** Other therapy:  *** Hormone/birth control:  *** Other medications:  ***  Past NSAIDS/analgesics:  tramadol, Excedrin, naproxen, acetaminophen Past abortive triptans:  *** Past abortive ergotamine:  *** Past muscle relaxants:  baclofen, tizanidine Past anti-emetic:  *** Past antihypertensive medications:  propranolol Past antidepressant medications:  sertraline Past anticonvulsant medications:  topiramate  Past anti-CGRP:  *** Past vitamins/Herbal/Supplements:  Mg, CoQ10, B12 Past antihistamines/decongestants:  none Other past therapies:  none  Caffeine:  1/2 cup coffee daily Alcohol:  no Smoker:  yes.  Uses nicotine patch Diet:  Hydrates, eats salads, no fried foods or sweets Exercise:  5 miles a week Depression:  yes; Anxiety:  yes Other pain:  Fibromyalgia, RA, chronic back pain Sleep hygiene:  poor due to pain Family history of headache:  unknown      PAST  MEDICAL HISTORY:  Past Medical History:  Diagnosis Date   Anxiety    Arthritis    COPD (chronic obstructive pulmonary disease) (HCC)    Depression    Neuromuscular disorder (HCC)    Osteoporosis    Seizures (HCC)    Sleep apnea    Ulcer     PAST SURGICAL HISTORY: Past Surgical History:  Procedure Laterality Date   ROTATOR CUFF REPAIR Left 2009    MEDICATIONS: Current Outpatient Medications on File Prior to Visit  Medication Sig Dispense Refill   albuterol (PROVENTIL HFA;VENTOLIN HFA) 108 (90 Base) MCG/ACT inhaler Inhale 1 puff into the lungs every 6 (six) hours as needed for wheezing or shortness of breath. 1 Inhaler 6   ALPRAZolam (XANAX) 0.5 MG tablet Take 1 tablet (0.5 mg total) by mouth at bedtime as needed. 30 tablet 1   AMITIZA 24 MCG capsule TAKE 1 CAPSULE BY MOUTH TWICE DAILY WITH MEALS 60 capsule 5   atorvastatin (LIPITOR) 40 MG tablet Take 1 tablet (40 mg total) by mouth daily. 90 tablet 3   baclofen (LIORESAL) 10 MG tablet Take 10 mg by mouth 3 (three) times daily.     brexpiprazole (REXULTI) 1 MG TABS tablet Take 1.5 tablets (1.5 mg total) by mouth at bedtime. 30 tablet 1   buPROPion (WELLBUTRIN SR) 150 MG 12 hr tablet SMARTSIG:1 Tablet(s) By Mouth Every 12 Hours 30 tablet 1   cholecalciferol (VITAMIN D) 1000 units tablet Take 1 tablet (1,000 Units total) by mouth daily. 30 tablet 5   DULoxetine (CYMBALTA) 60 MG capsule Take 1 capsule (60 mg total) by mouth 2 (two) times daily. 60 capsule 1   eletriptan (RELPAX) 40 MG tablet TAKE ONE TABLET BY MOUTH AT EARLIEST ONSET OF MIGRAINE, MAY REPEAT ONCE IN 2 HOURS IF HEADACHE PERSISTS OR RECURS 9 tablet 3   Fluticasone-Salmeterol (ADVAIR) 250-50 MCG/DOSE AEPB Inhale 1 puff into the lungs 2 (two) times daily. 60 each 5   folic acid (FOLVITE) 1 MG tablet Take 1 tablet (1 mg total) by mouth daily. 30 tablet 5   furosemide (LASIX) 40 MG tablet Take 1 tablet (40 mg total) by mouth daily. 30 tablet 5   HYDROcodone-acetaminophen  (NORCO/VICODIN) 5-325 MG tablet Take by mouth.     hydrocortisone (ANUSOL-HC) 2.5 % rectal cream Place 1 application rectally 2 (two) times daily. 30 g 1   ibuprofen (ADVIL,MOTRIN) 800 MG tablet TAKE 1 TABLET BY MOUTH EVERY 8 HOURS AS NEEDED 90 tablet 2   ibuprofen (ADVIL,MOTRIN) 800 MG tablet Take 1 tablet (800 mg total) by mouth every 8 (eight) hours as needed. 90 tablet 3   lamoTRIgine (LAMICTAL) 200 MG tablet Take 1 tablet (200 mg total) by mouth 2 (two) times daily. 180 tablet 0   metFORMIN (GLUCOPHAGE) 500 MG tablet Take 1 tablet (500 mg total) by mouth 2 (two) times daily with a meal. 60 tablet 1   methocarbamol (ROBAXIN) 500 MG tablet Take 1 tablet (500 mg total) by mouth every 8 (eight) hours as needed for muscle spasms. 30 tablet 1   methotrexate 2.5 MG tablet TAKE 8 TABLETS BY MOUTH ONCE A WEEK 32 tablet 5   naproxen (NAPROSYN) 500 MG tablet Take one with every sumatriptan dose 60 tablet 1   pantoprazole (PROTONIX) 40 MG tablet TAKE 1 TABLET BY MOUTH ONCE DAILY 30 tablet 5   polyethylene glycol-electrolytes (TRILYTE) 420 g solution Take 4,000 mLs by mouth as directed. 4000 mL 0   prazosin (MINIPRESS) 2 MG capsule Take 1 capsule (2 mg  total) by mouth at bedtime. 30 capsule 1   promethazine (PHENERGAN) 25 MG tablet Take 1 tablet (25 mg total) by mouth every 6 (six) hours as needed for nausea or vomiting. 30 tablet 0   propranolol ER (INDERAL LA) 60 MG 24 hr capsule Take 1 capsule (60 mg total) by mouth daily. 30 capsule 2   SUMAtriptan (IMITREX) 100 MG tablet Take 1 tablet (100 mg total) by mouth once as needed for migraine. May repeat in 2 hours if headache persists or recurs. 10 tablet 2   vitamin B-12 (CYANOCOBALAMIN) 1000 MCG tablet Take 500 mcg by mouth daily.     Vitamin D, Ergocalciferol, (DRISDOL) 50000 units CAPS capsule TAKE 1 CAPSULE BY MOUTH TWICE A WEEK 16 capsule 0   [DISCONTINUED] pregabalin (LYRICA) 75 MG capsule Take 1 capsule (75 mg total) by mouth 3 (three) times daily.  90 capsule 1   No current facility-administered medications on file prior to visit.    ALLERGIES: Allergies  Allergen Reactions   Meloxicam Other (See Comments)    Interacts with other meds   Tramadol Other (See Comments)    Interacts with other meds   Robaxin [Methocarbamol]    Zanaflex [Tizanidine Hcl]     FAMILY HISTORY: Family History  Problem Relation Age of Onset   Depression Mother    Cancer Father    Diabetes Father    Hypertension Father    Drug abuse Sister    Colon cancer Neg Hx     Objective:  *** General: No acute distress.  Patient appears well-groomed.   Head:  Normocephalic/atraumatic Eyes:  fundi examined but not visualized Neck: supple, no paraspinal tenderness, full range of motion Back: No paraspinal tenderness Heart: regular rate and rhythm Lungs: Clear to auscultation bilaterally. Vascular: No carotid bruits. Neurological Exam: Mental status: alert and oriented to person, place, and time, recent and remote memory intact, fund of knowledge intact, attention and concentration intact, speech fluent and not dysarthric, language intact. Cranial nerves: CN I: not tested CN II: pupils equal, round and reactive to light, visual fields intact CN III, IV, VI:  full range of motion, no nystagmus, no ptosis CN V: facial sensation intact. CN VII: upper and lower face symmetric CN VIII: hearing intact CN IX, X: gag intact, uvula midline CN XI: sternocleidomastoid and trapezius muscles intact CN XII: tongue midline Bulk & Tone: normal, no fasciculations. Motor:  muscle strength 5/5 throughout Sensation:  Pinprick, temperature and vibratory sensation intact. Deep Tendon Reflexes:  2+ throughout,  toes downgoing.   Finger to nose testing:  Without dysmetria.   Heel to shin:  Without dysmetria.   Gait:  Normal station and stride.  Romberg negative.    Thank you for allowing me to take part in the care of this patient.  Shon MilletAdam Naama Sappington, DO  CC:  ***

## 2021-06-07 ENCOUNTER — Ambulatory Visit (HOSPITAL_COMMUNITY): Payer: Medicare HMO | Admitting: Licensed Clinical Social Worker

## 2021-06-08 ENCOUNTER — Ambulatory Visit: Payer: Medicare HMO | Admitting: Neurology

## 2021-06-20 ENCOUNTER — Ambulatory Visit (HOSPITAL_COMMUNITY): Payer: Medicare HMO | Admitting: Licensed Clinical Social Worker

## 2021-06-26 ENCOUNTER — Telehealth (HOSPITAL_COMMUNITY): Payer: Self-pay | Admitting: Psychiatry

## 2021-06-26 NOTE — Telephone Encounter (Signed)
Per pt  ?[facility] is telling her that the Prudy Feeler is needing a prior [place].  ?She states she has another refill, But they will not fill it until the prior auth ? ?CB [phone] ? ? ?

## 2021-06-26 NOTE — Telephone Encounter (Signed)
Medication management - Telephone call with patient, after speaking to Florentina AddisonKatie, pharmacist at [facility] [place] to verify pt is in need of a prior authorization to fill her Alprazolam.  Informed patient this would be completed today and patient's pharmacy would be notified once approved.  Patient agreed with plan.  ?

## 2021-06-26 NOTE — Telephone Encounter (Signed)
Medication managment - prior authorization for patient's prescribed Alprazolam 0.5 mg, one by mouth at bedtime as needed, #30 for 30 days submitted online with CoverMyMeds. Prior Authorization approval pending.  ?

## 2021-06-27 NOTE — Telephone Encounter (Signed)
Medication was approved by [facility] ?Expires 04/22/22 ?

## 2021-07-06 ENCOUNTER — Ambulatory Visit (HOSPITAL_COMMUNITY): Payer: Medicare Other | Admitting: Psychiatry

## 2021-07-13 ENCOUNTER — Telehealth (INDEPENDENT_AMBULATORY_CARE_PROVIDER_SITE_OTHER): Payer: Medicare HMO | Admitting: Psychiatry

## 2021-07-13 ENCOUNTER — Encounter (HOSPITAL_COMMUNITY): Payer: Self-pay | Admitting: Psychiatry

## 2021-07-13 DIAGNOSIS — F411 Generalized anxiety disorder: Secondary | ICD-10-CM | POA: Diagnosis not present

## 2021-07-13 DIAGNOSIS — F0630 Mood disorder due to known physiological condition, unspecified: Secondary | ICD-10-CM | POA: Diagnosis not present

## 2021-07-13 DIAGNOSIS — F321 Major depressive disorder, single episode, moderate: Secondary | ICD-10-CM

## 2021-07-13 DIAGNOSIS — F4310 Post-traumatic stress disorder, unspecified: Secondary | ICD-10-CM

## 2021-07-13 MED ORDER — PRAZOSIN HCL 2 MG PO CAPS: 2.0000 mg | ORAL_CAPSULE | Freq: Every day | ORAL | 1 refills | Status: DC

## 2021-07-13 MED ORDER — BREXPIPRAZOLE 1 MG PO TABS: 1.5000 mg | ORAL_TABLET | Freq: Every day | ORAL | 1 refills | Status: DC

## 2021-07-13 MED ORDER — ALPRAZOLAM 0.5 MG PO TABS: 0.5000 mg | ORAL_TABLET | Freq: Every evening | ORAL | 1 refills | Status: DC | PRN

## 2021-07-13 MED ORDER — LAMOTRIGINE 200 MG PO TABS: 200.0000 mg | ORAL_TABLET | Freq: Two times a day (BID) | ORAL | 0 refills | Status: DC

## 2021-07-13 MED ORDER — DULOXETINE HCL 60 MG PO CPEP: 60.0000 mg | ORAL_CAPSULE | Freq: Two times a day (BID) | ORAL | 1 refills | Status: DC

## 2021-07-13 NOTE — Progress Notes (Signed)
[facility] Follow up visit ? ?Patient Identification: Jessica Vaughn ?MRN:  [id number] ?Date of Evaluation:  07/13/2021 ?Referral Source: [facility] ?Chief Complaint:  follow up depression, irregular sleep ?Visit Diagnosis:  ?  ICD-10-CM   ?1. GAD (generalized anxiety disorder)  F41.1   ?  ?2. Current moderate episode of major depressive disorder without prior episode (HCC)  F32.1 lamoTRIgine (LAMICTAL) 200 MG tablet  ?  ?3. Mood disorder in conditions classified elsewhere  F06.30   ?  ?4. PTSD (post-traumatic stress disorder)  F43.10   ?  ? ?Virtual Visit via Video Note ? ?I connected with Hisae Villa on 07/13/21 at 11:30 AM EDT by a video enabled telemedicine application and verified that I am speaking with the correct person using two identifiers. ? ?Location: ?Patient: home ?Provider: office ?  ?I discussed the limitations of evaluation and management by telemedicine and the availability of in person appointments. The patient expressed understanding and agreed to proceed. ? ?  ?I discussed the assessment and treatment plan with the patient. The patient was provided an opportunity to ask questions and all were answered. The patient agreed with the plan and demonstrated an understanding of the instructions. ?  ?The patient was advised to call back or seek an in-person evaluation if the symptoms worsen or if the condition fails to improve as anticipated. ? ?I provided 15 minutes of non-face-to-face time during this encounter. ? ? ? ? ?History of Present Illness: Patient is a 56 years old single Caucasian female initially  referred by pain clinic for management and to establish care for depression and possible bipolar.  she suffers from chronic pain rheumatoid arthritis ? ? ?Arthritis effects sleep and mood  ?Sons are doing good, one joining [employer] ?They help her out, also has home health aid  ?Takes xanax at night for sleep otherwise gets anxious ?Overall feels meds keeping balance and wants to  continue tehrapy for ptsd, avoids triggers ?Minipress helps nightmares ? ?Aggravating factors; co morbid medical conditions, fibromyalgia , past trauma ?Modifying factors; boys ? ?Duration since young age ?Severity fluctuates but not worse ? ?Past Psychiatric History: depression, anxiety  ? ?Previous Psychotropic Medications: Yes  ? ? ? ?Past Medical History:  ?Past Medical History:  ?Diagnosis Date  ? Anxiety   ? Arthritis   ? COPD (chronic obstructive pulmonary disease) (HCC)   ? Depression   ? Neuromuscular disorder (HCC)   ? Osteoporosis   ? Seizures (HCC)   ? Sleep apnea   ? Ulcer   ?  ?Past Surgical History:  ?Procedure Laterality Date  ? ROTATOR CUFF REPAIR Left 2009  ? ? ?Family Psychiatric History: mom : depression ? ?Family History:  ?Family History  ?Problem Relation Age of Onset  ? Depression Mother   ? Cancer Father   ? Diabetes Father   ? Hypertension Father   ? Drug abuse Sister   ? Colon cancer Neg Hx   ? ? ?Social History:   ?Social History  ? ?Socioeconomic History  ? Marital status: Divorced  ?  Spouse name: Not on file  ? Number of children: 2  ? Years of education: Not on file  ? Highest education level: Not on file  ?Occupational History  ? Not on file  ?Tobacco Use  ? Smoking status: Every Day  ?  Packs/day: 0.50  ?  Types: Cigarettes  ? Smokeless tobacco: Never  ?Vaping Use  ? Vaping Use: Never used  ?Substance and Sexual Activity  ? Alcohol  use: No  ? Drug use: No  ? Sexual activity: Yes  ?  Birth control/protection: Post-menopausal  ?Other Topics Concern  ? Not on file  ?Social History Narrative  ? Lives with 2 sons, 1 is disabled. Moved her recently for [place].   ? ?Social Determinants of Health  ? ?Financial Resource Strain: Not on file  ?Food Insecurity: Not on file  ?Transportation Needs: Not on file  ?Physical Activity: Not on file  ?Stress: Not on file  ?Social Connections: Not on file  ? ? ? ? ?Allergies:   ?Allergies  ?Allergen Reactions  ? Meloxicam Other (See Comments)  ?   Interacts with other meds  ? Tramadol Other (See Comments)  ?  Interacts with other meds  ? Robaxin [Methocarbamol]   ? Zanaflex [Tizanidine Hcl]   ? ? ?Metabolic Disorder Labs: ?Lab Results  ?Component Value Date  ? HGBA1C 5.5 11/06/2016  ? ?No results found for: PROLACTIN ?Lab Results  ?Component Value Date  ? CHOL 238 (H) 11/06/2016  ? TRIG 144 11/06/2016  ? HDL 52 11/06/2016  ? CHOLHDL 4.6 (H) 11/06/2016  ? LDLCALC 157 (H) 11/06/2016  ? ?Lab Results  ?Component Value Date  ? TSH 1.740 11/13/2016  ? ? ?Therapeutic Level Labs: ?No results found for: LITHIUM ?No results found for: CBMZ ?No results found for: VALPROATE ? ?Current Medications: ?Current Outpatient Medications  ?Medication Sig Dispense Refill  ? albuterol (PROVENTIL HFA;VENTOLIN HFA) 108 (90 Base) MCG/ACT inhaler Inhale 1 puff into the lungs every 6 (six) hours as needed for wheezing or shortness of breath. 1 Inhaler 6  ? ALPRAZolam (XANAX) 0.5 MG tablet Take 1 tablet (0.5 mg total) by mouth at bedtime as needed. 30 tablet 1  ? AMITIZA 24 MCG capsule TAKE 1 CAPSULE BY MOUTH TWICE DAILY WITH MEALS 60 capsule 5  ? atorvastatin (LIPITOR) 40 MG tablet Take 1 tablet (40 mg total) by mouth daily. 90 tablet 3  ? baclofen (LIORESAL) 10 MG tablet Take 10 mg by mouth 3 (three) times daily.    ? brexpiprazole (REXULTI) 1 MG TABS tablet Take 1.5 tablets (1.5 mg total) by mouth at bedtime. 30 tablet 1  ? buPROPion (WELLBUTRIN SR) 150 MG 12 hr tablet SMARTSIG:1 Tablet(s) By Mouth Every 12 Hours 30 tablet 1  ? cholecalciferol (VITAMIN D) 1000 units tablet Take 1 tablet (1,000 Units total) by mouth daily. 30 tablet 5  ? DULoxetine (CYMBALTA) 60 MG capsule Take 1 capsule (60 mg total) by mouth 2 (two) times daily. 60 capsule 1  ? eletriptan (RELPAX) 40 MG tablet TAKE ONE TABLET BY MOUTH AT EARLIEST ONSET OF MIGRAINE, MAY REPEAT ONCE IN 2 HOURS IF HEADACHE PERSISTS OR RECURS 9 tablet 3  ? Fluticasone-Salmeterol (ADVAIR) 250-50 MCG/DOSE AEPB Inhale 1 puff into the lungs  2 (two) times daily. 60 each 5  ? folic acid (FOLVITE) 1 MG tablet Take 1 tablet (1 mg total) by mouth daily. 30 tablet 5  ? furosemide (LASIX) 40 MG tablet Take 1 tablet (40 mg total) by mouth daily. 30 tablet 5  ? HYDROcodone-acetaminophen (NORCO/VICODIN) 5-325 MG tablet Take by mouth.    ? hydrocortisone (ANUSOL-HC) 2.5 % rectal cream Place 1 application rectally 2 (two) times daily. 30 g 1  ? ibuprofen (ADVIL,MOTRIN) 800 MG tablet TAKE 1 TABLET BY MOUTH EVERY 8 HOURS AS NEEDED 90 tablet 2  ? ibuprofen (ADVIL,MOTRIN) 800 MG tablet Take 1 tablet (800 mg total) by mouth every 8 (eight) hours as needed. 90 tablet 3  ?  lamoTRIgine (LAMICTAL) 200 MG tablet Take 1 tablet (200 mg total) by mouth 2 (two) times daily. 180 tablet 0  ? metFORMIN (GLUCOPHAGE) 500 MG tablet Take 1 tablet (500 mg total) by mouth 2 (two) times daily with a meal. 60 tablet 1  ? methocarbamol (ROBAXIN) 500 MG tablet Take 1 tablet (500 mg total) by mouth every 8 (eight) hours as needed for muscle spasms. 30 tablet 1  ? methotrexate 2.5 MG tablet TAKE 8 TABLETS BY MOUTH ONCE A WEEK 32 tablet 5  ? naproxen (NAPROSYN) 500 MG tablet Take one with every sumatriptan dose 60 tablet 1  ? pantoprazole (PROTONIX) 40 MG tablet TAKE 1 TABLET BY MOUTH ONCE DAILY 30 tablet 5  ? polyethylene glycol-electrolytes (TRILYTE) 420 g solution Take 4,000 mLs by mouth as directed. 4000 mL 0  ? prazosin (MINIPRESS) 2 MG capsule Take 1 capsule (2 mg total) by mouth at bedtime. 30 capsule 1  ? promethazine (PHENERGAN) 25 MG tablet Take 1 tablet (25 mg total) by mouth every 6 (six) hours as needed for nausea or vomiting. 30 tablet 0  ? propranolol ER (INDERAL LA) 60 MG 24 hr capsule Take 1 capsule (60 mg total) by mouth daily. 30 capsule 2  ? SUMAtriptan (IMITREX) 100 MG tablet Take 1 tablet (100 mg total) by mouth once as needed for migraine. May repeat in 2 hours if headache persists or recurs. 10 tablet 2  ? vitamin B-12 (CYANOCOBALAMIN) 1000 MCG tablet Take 500 mcg by  mouth daily.    ? Vitamin D, Ergocalciferol, (DRISDOL) 50000 units CAPS capsule TAKE 1 CAPSULE BY MOUTH TWICE A WEEK 16 capsule 0  ? ?No current facility-administered medications for this visit.  ? ? ? ? ?Psych

## 2021-07-21 ENCOUNTER — Ambulatory Visit (INDEPENDENT_AMBULATORY_CARE_PROVIDER_SITE_OTHER): Payer: Medicare HMO | Admitting: Licensed Clinical Social Worker

## 2021-07-21 DIAGNOSIS — F4310 Post-traumatic stress disorder, unspecified: Secondary | ICD-10-CM

## 2021-07-21 DIAGNOSIS — F411 Generalized anxiety disorder: Secondary | ICD-10-CM | POA: Diagnosis not present

## 2021-07-21 DIAGNOSIS — M797 Fibromyalgia: Secondary | ICD-10-CM

## 2021-07-21 DIAGNOSIS — F0630 Mood disorder due to known physiological condition, unspecified: Secondary | ICD-10-CM

## 2021-07-21 DIAGNOSIS — F321 Major depressive disorder, single episode, moderate: Secondary | ICD-10-CM

## 2021-07-21 NOTE — Progress Notes (Signed)
Virtual Visit via Telephone Note ? ?I connected with Jessica Vaughn on 07/21/21 at 10:00 AM EDT by telephone and verified that I am speaking with the correct person using two identifiers. ? ?Location: ?Patient: home ?Provider: home office ?  ?I discussed the limitations, risks, security and privacy concerns of performing an evaluation and management service by telephone and the availability of in person appointments. I also discussed with the patient that there may be a patient responsible charge related to this service. The patient expressed understanding and agreed to proceed. ? ?I discussed the assessment and treatment plan with the patient. The patient was provided an opportunity to ask questions and all were answered. The patient agreed with the plan and demonstrated an understanding of the instructions. ?  ?The patient was advised to call back or seek an in-person evaluation if the symptoms worsen or if the condition fails to improve as anticipated. ? ?I provided 30 minutes of non-face-to-face time during this encounter. ? ?THERAPIST PROGRESS NOTE ? ?Session Time: 10:00 AM to 10:30 AM ? ?Participation Level: Active ? ?Behavioral Response: CasualAlertappropriate ? ?Type of Therapy: Individual Therapy ? ?Treatment Goals addressed: PTSD, processed through feelings related to unresolved issues she has  with friend who has passed, anxiety, depression, coping ? ?ProgressTowards Goals: Progressing-patient continues to use therapy to help process and work through issues ?Interventions: CBT, Solution Focused, Strength-based, Supportive, and Other: Coping ? ?Summary: Jessica Vaughn is a 56 y.o. female who presents with been really busy graduation, youngest son health issues. Put her therapy off for awhile, oldest will have a summer job and then figure out where he is going to go maybe [place]. A lot going on so tired. Son had testing and found out he is Insulin resistant, high cholesterol and high blood pressure  has to take Insulin, needs to have an ultra sound on abdomen, liver numbers up as well. Patients knee replacement coming up in June. It has been stressful thinking about these things. Doesn't "know whether to  scratch head or butt". By the end of the year wants to publish a book, writing a book has been working on it for a year. Finally came up with title "In My Own Words" true story of her life but know bother a lot of people, don't care they need to be accountable the way they hide behind their lies. Patient write about what actually happened. From what they say pretending never happened a bunch of crap tired of "ditching it all". Have proof to back it up. Doctor's records, court records. Doesn't know if therapeutic at this point. Up in the air with it. Taking it a day at a time and see how it goes. No matter how many years never get out of mind always be there. Years go by things come along the way or things that remind you of it, doesn't change.  Noted for patient focus for therapy includes having trouble getting over a lot of things that have been hard.  Better when quieter easier for her to talk. ? ?Therapist reviewed any significant events or changes in functioning as last talked to patient and January.  Reviewed stressors utilize CBT and solution focused therapy to address stressors and mood.  Therapist noted possible benefits of patient riding her story that it can be therapeutic explored this with patient and she is unsure still.  Session was short though identified focus patient on working on getting through things that have been difficult in therapy.  Therapist provided active  listening open questions supportive interventions. ? ?Suicidal/Homicidal: No ? ?Plan: Return again in 4 weeks.2.  Processed through things patient is having a difficult time getting over, frustrations stressors to help with mood ? ?Diagnosis: major depressive disorder, moderate, recurrent, generalized anxiety disorder, PTSD, mood  disorder in conditions classified elsewhere ? ?Collaboration of Care: Other review of Dr. Gilmore Laroche note for med management ? ?Patient/Guardian was advised Release of Information must be obtained prior to any record release in order to collaborate their care with an outside provider. Patient/Guardian was advised if they have not already done so to contact the registration department to sign all necessary forms in order for [place] to release information regarding their care.  ? ?Consent: Patient/Guardian gives verbal consent for treatment and assignment of benefits for services provided during this visit. Patient/Guardian expressed understanding and agreed to proceed.  ? ?Coolidge Breeze, LCSW ?07/21/2021 ? ?

## 2021-08-22 ENCOUNTER — Ambulatory Visit (INDEPENDENT_AMBULATORY_CARE_PROVIDER_SITE_OTHER): Payer: Medicare HMO | Admitting: Licensed Clinical Social Worker

## 2021-08-22 DIAGNOSIS — F321 Major depressive disorder, single episode, moderate: Secondary | ICD-10-CM | POA: Diagnosis not present

## 2021-08-22 DIAGNOSIS — F4310 Post-traumatic stress disorder, unspecified: Secondary | ICD-10-CM

## 2021-08-22 DIAGNOSIS — F411 Generalized anxiety disorder: Secondary | ICD-10-CM

## 2021-08-22 DIAGNOSIS — F0630 Mood disorder due to known physiological condition, unspecified: Secondary | ICD-10-CM | POA: Diagnosis not present

## 2021-08-22 DIAGNOSIS — M797 Fibromyalgia: Secondary | ICD-10-CM

## 2021-08-22 NOTE — Progress Notes (Signed)
Virtual Visit via Video Note ? ?I connected with Jessica Vaughn on 08/22/21 at  3:00 PM EDT by a video enabled telemedicine application and verified that I am speaking with the correct person using two identifiers. ? ?Location: ?Patient: home ?Provider: office ?  ?I discussed the limitations of evaluation and management by telemedicine and the availability of in person appointments. The patient expressed understanding and agreed to proceed. ? ?I discussed the assessment and treatment plan with the patient. The patient was provided an opportunity to ask questions and all were answered. The patient agreed with the plan and demonstrated an understanding of the instructions. ?  ?The patient was advised to call back or seek an in-person evaluation if the symptoms worsen or if the condition fails to improve as anticipated. ? ?I provided 20 minutes of non-face-to-face time during this encounter. ? ?THERAPIST PROGRESS NOTE ? ?Session Time: 3:00 PM to 3:20 PM ? ?Participation Level: Active ? ?Behavioral Response: CasualAlertAnxious and Dysphoric ? ?Type of Therapy: Individual Therapy ? ?Treatment Goals addressed: PTSD, processed through feelings related to unresolved issues she has  with friend who has passed, anxiety, depression, coping ? ?ProgressTowards Goals: Progressing-patient actively using session to help cope with anxiety processing feelings and discussed problem solving strategies as well as highlighting events that are helpful for her mood ? ?Interventions: Solution Focused, Strength-based, Supportive, Reframing, and Other: Coping ? ?Summary: Jessica Vaughn is a 56 y.o. female who presents with sick for a few days. Not feeling well at all. Court hearing 10th for back pay for social security postponed. Attorney talking about settling out of court. May give up a year pf back pay out of 4/5 years but thinks go  ahead and do it she fighting for 10 years. 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  Therapist noted this may help relieve stressors make her feel lighter also positive information is shared about her sons. There was a reward ceremony he got 5 different rewards. He was happy she went wrote a nice message in messenger really touched her. Worried about knee replacement next month had surgeries that didn't worry this is more severe 3-4 hour surgery. Have to stay in hospital worries about house and youngest son. Younger son not doing too bad needs to get out and exercise patient says a lot like him and also gets along with both boys and not worried them.  Younger son is insulin resistance need to take injection and liver enzymes high. High cholesterol. He has depression like her, has bipolar too.  Sees ways they are very similar. Patient shares no energy to do much tired and not feeling like doing anything at all. Can't sleep well at night lot going on her mind doesn't want to shut down, also related to PTSD and dreams. Bathroom throwing up last couple of days migraines going to check in with doctor thinks it is a bug.   ?Therapist noted patient's pattern of being up and down with being sick patient notices it to possibly medical issues could make her immune system more vulnerable.  Therapist reviewed symptoms in general with patient, facilitated expression of thoughts and feeling utilizing this as treatment intervention to help patient process feelings related to stressors.  Engaged in CBT and solution focused therapy particularly encouraging problem-solving to help her with stress anxiety.  At the same time validating patient stressors with different things  that are coming up for her.  Noted patient's own ability to reframe helpful for her stressors such as pointing out good relationship with boys so helps her not worry about them.  Also able to share positive news about her boys this helps patient with  coping and mood. ? ?Suicidal/Homicidal: No ? ?Plan: Return again in 3 weeks.2. Processed through things patient is having a difficult time getting over, frustrations stressors to help with mood ? ?Diagnosis:  depressive disorder, moderate, recurrent, generalized anxiety disorder, PTSD, mood disorder in conditions classified elsewhere ? ? ?Collaboration of Care: Other none needed ? ?Patient/Guardian was advised Release of Information must be obtained prior to any record release in order to collaborate their care with an outside provider. Patient/Guardian was advised if they have not already done so to contact the registration department to sign all necessary forms in order for us to release information regarding their care.  ? ?Consent: Patient/Guardian gives verbal consent for treatment and assignment of benefits for services provided during this visit. Patient/Guardian expressed understanding and agreed to proceed.  ? ?Coolidge BreezeMary Alger Kerstein, LCSW ?08/22/2021 ? ?

## 2021-09-12 ENCOUNTER — Ambulatory Visit (HOSPITAL_COMMUNITY): Payer: Medicare HMO | Admitting: Licensed Clinical Social Worker

## 2021-09-27 ENCOUNTER — Telehealth (HOSPITAL_COMMUNITY): Payer: Self-pay

## 2021-09-27 NOTE — Telephone Encounter (Signed)
New message   1. Which medications need to be refilled? (please list name of each medication and dose if known) ALPRAZolam (XANAX) 0.5 MG tablet  2. Which pharmacy/location (including street and city if local pharmacy) is medication to be sent to?[facility] [address], [place] - [address]  3. Do they need a 30 day or 90 day supply? 30 day supply

## 2021-09-27 NOTE — Telephone Encounter (Signed)
This patient does not have a return appt with Dr. De Nurse on the book. I will send in Rx once she does.

## 2021-09-28 ENCOUNTER — Other Ambulatory Visit (HOSPITAL_COMMUNITY): Payer: Self-pay | Admitting: Psychiatry

## 2021-09-28 MED ORDER — ALPRAZOLAM 0.5 MG PO TABS: 0.5000 mg | ORAL_TABLET | Freq: Every evening | ORAL | 0 refills | Status: DC | PRN

## 2021-09-28 NOTE — Telephone Encounter (Signed)
Appt made for 07/07

## 2021-09-28 NOTE — Telephone Encounter (Signed)
sent 

## 2021-10-25 ENCOUNTER — Ambulatory Visit (HOSPITAL_COMMUNITY): Payer: Medicare HMO | Admitting: Licensed Clinical Social Worker

## 2021-10-27 ENCOUNTER — Telehealth (INDEPENDENT_AMBULATORY_CARE_PROVIDER_SITE_OTHER): Payer: Medicare HMO | Admitting: Psychiatry

## 2021-10-27 ENCOUNTER — Encounter (HOSPITAL_COMMUNITY): Payer: Self-pay | Admitting: Psychiatry

## 2021-10-27 DIAGNOSIS — F321 Major depressive disorder, single episode, moderate: Secondary | ICD-10-CM | POA: Diagnosis not present

## 2021-10-27 DIAGNOSIS — M797 Fibromyalgia: Secondary | ICD-10-CM | POA: Diagnosis not present

## 2021-10-27 DIAGNOSIS — F4310 Post-traumatic stress disorder, unspecified: Secondary | ICD-10-CM

## 2021-10-27 DIAGNOSIS — F411 Generalized anxiety disorder: Secondary | ICD-10-CM | POA: Diagnosis not present

## 2021-10-27 MED ORDER — BUPROPION HCL ER (SR) 150 MG PO TB12: ORAL_TABLET | ORAL | 1 refills | Status: DC

## 2021-10-27 MED ORDER — ALPRAZOLAM 0.5 MG PO TABS: 0.5000 mg | ORAL_TABLET | Freq: Every evening | ORAL | 0 refills | Status: DC | PRN

## 2021-10-27 MED ORDER — DULOXETINE HCL 60 MG PO CPEP: 60.0000 mg | ORAL_CAPSULE | Freq: Two times a day (BID) | ORAL | 1 refills | Status: DC

## 2021-10-27 MED ORDER — PRAZOSIN HCL 2 MG PO CAPS: 2.0000 mg | ORAL_CAPSULE | Freq: Every day | ORAL | 1 refills | Status: DC

## 2021-10-27 NOTE — Progress Notes (Signed)
[facility] Follow up visit  Patient Identification: Jessica Vaughn MRN:  [id number] Date of Evaluation:  10/27/2021 Referral Source: [facility] Chief Complaint:  follow up depression, irregular sleep Visit Diagnosis:    ICD-10-CM   1. Current moderate episode of major depressive disorder without prior episode (HCC)  F32.1     2. GAD (generalized anxiety disorder)  F41.1     3. PTSD (post-traumatic stress disorder)  F43.10     4. Fibromyalgia  M79.7      Virtual Visit via Video Note  I connected with Jessica Vaughn on 10/27/21 at 10:00 AM EDT by a video enabled telemedicine application and verified that I am speaking with the correct person using two identifiers.  Location: Patient: home Provider: home office   I discussed the limitations of evaluation and management by telemedicine and the availability of in person appointments. The patient expressed understanding and agreed to proceed.    I discussed the assessment and treatment plan with the patient. The patient was provided an opportunity to ask questions and all were answered. The patient agreed with the plan and demonstrated an understanding of the instructions.   The patient was advised to call back or seek an in-person evaluation if the symptoms worsen or if the condition fails to improve as anticipated.  I provided 15 minutes of non-face-to-face time during this encounter.     History of Present Illness: Patient is a 56 years old single Caucasian female initially  referred by pain clinic for management and to establish care for depression and possible bipolar.  she suffers from chronic pain rheumatoid arthritis   Arthritis effects sleep and mood  Overall doing fair, helps her boys, not graduated,  Less nightmares in therapy    Aggravating factors; co morbid medical conditions, fibromyalgia , past trauma Modifying factors; boys  Duration since young age Severity fluctuates but not worse  Past  Psychiatric History: depression, anxiety   Previous Psychotropic Medications: Yes     Past Medical History:  Past Medical History:  Diagnosis Date   Anxiety    Arthritis    COPD (chronic obstructive pulmonary disease) (HCC)    Depression    Neuromuscular disorder (HCC)    Osteoporosis    Seizures (HCC)    Sleep apnea    Ulcer     Past Surgical History:  Procedure Laterality Date   ROTATOR CUFF REPAIR Left 2009    Family Psychiatric History: mom : depression  Family History:  Family History  Problem Relation Age of Onset   Depression Mother    Cancer Father    Diabetes Father    Hypertension Father    Drug abuse Sister    Colon cancer Neg Hx     Social History:   Social History   Socioeconomic History   Marital status: Divorced    Spouse name: Not on file   Number of children: 2   Years of education: Not on file   Highest education level: Not on file  Occupational History   Not on file  Tobacco Use   Smoking status: Every Day    Packs/day: 0.50    Types: Cigarettes   Smokeless tobacco: Never  Vaping Use   Vaping Use: Never used  Substance and Sexual Activity   Alcohol use: No   Drug use: No   Sexual activity: Yes    Birth control/protection: Post-menopausal  Other Topics Concern   Not on file  Social History Narrative   Lives with 2 sons,  1 is disabled. Moved her recently for [place].    Social Determinants of Health   Financial Resource Strain: Not on file  Food Insecurity: Not on file  Transportation Needs: Not on file  Physical Activity: Not on file  Stress: Not on file  Social Connections: Not on file      Allergies:   Allergies  Allergen Reactions   Meloxicam Other (See Comments)    Interacts with other meds   Tramadol Other (See Comments)    Interacts with other meds   Robaxin [Methocarbamol]    Zanaflex [Tizanidine Hcl]     Metabolic Disorder Labs: Lab Results  Component Value Date   HGBA1C 5.5 11/06/2016   No  results found for: "PROLACTIN" Lab Results  Component Value Date   CHOL 238 (H) 11/06/2016   TRIG 144 11/06/2016   HDL 52 11/06/2016   CHOLHDL 4.6 (H) 11/06/2016   LDLCALC 157 (H) 11/06/2016   Lab Results  Component Value Date   TSH 1.740 11/13/2016    Therapeutic Level Labs: No results found for: "LITHIUM" No results found for: "CBMZ" No results found for: "VALPROATE"  Current Medications: Current Outpatient Medications  Medication Sig Dispense Refill   albuterol (PROVENTIL HFA;VENTOLIN HFA) 108 (90 Base) MCG/ACT inhaler Inhale 1 puff into the lungs every 6 (six) hours as needed for wheezing or shortness of breath. 1 Inhaler 6   ALPRAZolam (XANAX) 0.5 MG tablet Take 1 tablet (0.5 mg total) by mouth at bedtime as needed. 30 tablet 0   AMITIZA 24 MCG capsule TAKE 1 CAPSULE BY MOUTH TWICE DAILY WITH MEALS 60 capsule 5   atorvastatin (LIPITOR) 40 MG tablet Take 1 tablet (40 mg total) by mouth daily. 90 tablet 3   baclofen (LIORESAL) 10 MG tablet Take 10 mg by mouth 3 (three) times daily.     brexpiprazole (REXULTI) 1 MG TABS tablet Take 1.5 tablets (1.5 mg total) by mouth at bedtime. 30 tablet 1   buPROPion (WELLBUTRIN SR) 150 MG 12 hr tablet SMARTSIG:1 Tablet(s) By Mouth Every 12 Hours 30 tablet 1   cholecalciferol (VITAMIN D) 1000 units tablet Take 1 tablet (1,000 Units total) by mouth daily. 30 tablet 5   DULoxetine (CYMBALTA) 60 MG capsule Take 1 capsule (60 mg total) by mouth 2 (two) times daily. 60 capsule 1   eletriptan (RELPAX) 40 MG tablet TAKE ONE TABLET BY MOUTH AT EARLIEST ONSET OF MIGRAINE, MAY REPEAT ONCE IN 2 HOURS IF HEADACHE PERSISTS OR RECURS 9 tablet 3   Fluticasone-Salmeterol (ADVAIR) 250-50 MCG/DOSE AEPB Inhale 1 puff into the lungs 2 (two) times daily. 60 each 5   folic acid (FOLVITE) 1 MG tablet Take 1 tablet (1 mg total) by mouth daily. 30 tablet 5   furosemide (LASIX) 40 MG tablet Take 1 tablet (40 mg total) by mouth daily. 30 tablet 5    HYDROcodone-acetaminophen (NORCO/VICODIN) 5-325 MG tablet Take by mouth.     hydrocortisone (ANUSOL-HC) 2.5 % rectal cream Place 1 application rectally 2 (two) times daily. 30 g 1   ibuprofen (ADVIL,MOTRIN) 800 MG tablet TAKE 1 TABLET BY MOUTH EVERY 8 HOURS AS NEEDED 90 tablet 2   ibuprofen (ADVIL,MOTRIN) 800 MG tablet Take 1 tablet (800 mg total) by mouth every 8 (eight) hours as needed. 90 tablet 3   lamoTRIgine (LAMICTAL) 200 MG tablet Take 1 tablet (200 mg total) by mouth 2 (two) times daily. 180 tablet 0   metFORMIN (GLUCOPHAGE) 500 MG tablet Take 1 tablet (500 mg total) by mouth  2 (two) times daily with a meal. 60 tablet 1   methocarbamol (ROBAXIN) 500 MG tablet Take 1 tablet (500 mg total) by mouth every 8 (eight) hours as needed for muscle spasms. 30 tablet 1   methotrexate 2.5 MG tablet TAKE 8 TABLETS BY MOUTH ONCE A WEEK 32 tablet 5   naproxen (NAPROSYN) 500 MG tablet Take one with every sumatriptan dose 60 tablet 1   pantoprazole (PROTONIX) 40 MG tablet TAKE 1 TABLET BY MOUTH ONCE DAILY 30 tablet 5   polyethylene glycol-electrolytes (TRILYTE) 420 g solution Take 4,000 mLs by mouth as directed. 4000 mL 0   prazosin (MINIPRESS) 2 MG capsule Take 1 capsule (2 mg total) by mouth at bedtime. 30 capsule 1   promethazine (PHENERGAN) 25 MG tablet Take 1 tablet (25 mg total) by mouth every 6 (six) hours as needed for nausea or vomiting. 30 tablet 0   propranolol ER (INDERAL LA) 60 MG 24 hr capsule Take 1 capsule (60 mg total) by mouth daily. 30 capsule 2   SUMAtriptan (IMITREX) 100 MG tablet Take 1 tablet (100 mg total) by mouth once as needed for migraine. May repeat in 2 hours if headache persists or recurs. 10 tablet 2   vitamin B-12 (CYANOCOBALAMIN) 1000 MCG tablet Take 500 mcg by mouth daily.     Vitamin D, Ergocalciferol, (DRISDOL) 50000 units CAPS capsule TAKE 1 CAPSULE BY MOUTH TWICE A WEEK 16 capsule 0   No current facility-administered medications for this visit.      Psychiatric  Specialty Exam: Review of Systems  Cardiovascular:  Negative for chest pain.  Skin:  Negative for rash.  Neurological:  Negative for tremors.  Psychiatric/Behavioral:  Negative for agitation, hallucinations and self-injury.     There were no vitals taken for this visit.There is no height or weight on file to calculate BMI.  General Appearance: Casual  Eye Contact:  Fair  Speech:  Clear and Coherent  Volume:  Normal  Mood: fair  Affect:  Congruent  Thought Process:  Goal Directed  Orientation:  Full (Time, Place, and Person)  Thought Content:  Rumination  Suicidal Thoughts:  No  Homicidal Thoughts:  No  Memory:  Immediate;   Fair Recent;   Fair  Judgement:  Fair  Insight:  Fair  Psychomotor Activity:  Decreased  Concentration:  Concentration: Fair and Attention Span: Fair  Recall:  [employer] of Knowledge:Good  Language: Good  Akathisia:  No  Handed:   AIMS (if indicated):  not done  Assets:  Desire for Improvement Social Support  ADL's:  Intact  Cognition: WNL  Sleep:  Fair to variable decreased   Screenings: Mini-Mental    Flowsheet Row Office Visit from 11/13/2016 in [facility]  Total Score (max 30 points ) 15      PHQ2-9    Flowsheet Row Counselor from 08/22/2020 in [facility] [place] Video Visit from 06/30/2020 in [facility] AT [place] Office Visit from 11/13/2016 in [facility] Office Visit from 11/06/2016 in [facility] Office Visit from 10/04/2016 in [facility]  PHQ-2 Total Score 2 2 6 6  0  PHQ-9 Total Score 16 8 24 24  --      Flowsheet Row Video Visit from 10/27/2021 in [facility] AT [place] Video Visit from 07/13/2021 in [facility] AT [place] Video Visit from 04/20/2021 in [facility] AT [place]  C-SSRS RISK CATEGORY No  Risk No Risk No Risk       Assessment and Plan: as follows   Prior documentation reviewed    Mood disorder possible bipolar disorder, depressed: partial remission, : doing fair, continue cymbalta, rexulti No headaches, chest pain  GAD: manageable continue cymbalta Refills sent which were due on small dose xanax as well  PTSD: avoids triggers, works on distraction, continue cymbalta and therapy  Meds renewed Fu 45m.  Thresa Ross, MD 7/7/202310:10 AM

## 2021-11-24 ENCOUNTER — Ambulatory Visit (INDEPENDENT_AMBULATORY_CARE_PROVIDER_SITE_OTHER): Payer: Medicare HMO | Admitting: Licensed Clinical Social Worker

## 2021-11-24 DIAGNOSIS — F411 Generalized anxiety disorder: Secondary | ICD-10-CM

## 2021-11-24 DIAGNOSIS — F321 Major depressive disorder, single episode, moderate: Secondary | ICD-10-CM

## 2021-11-24 DIAGNOSIS — F0630 Mood disorder due to known physiological condition, unspecified: Secondary | ICD-10-CM

## 2021-11-24 DIAGNOSIS — F4310 Post-traumatic stress disorder, unspecified: Secondary | ICD-10-CM

## 2021-11-24 NOTE — Progress Notes (Signed)
Therapist sent email for session and patient did not respond session is a no show

## 2021-11-26 ENCOUNTER — Other Ambulatory Visit (HOSPITAL_COMMUNITY): Payer: Self-pay | Admitting: Psychiatry

## 2022-01-26 ENCOUNTER — Encounter (HOSPITAL_COMMUNITY): Payer: Self-pay | Admitting: Psychiatry

## 2022-01-26 ENCOUNTER — Telehealth (INDEPENDENT_AMBULATORY_CARE_PROVIDER_SITE_OTHER): Payer: Medicare HMO | Admitting: Psychiatry

## 2022-01-26 DIAGNOSIS — F411 Generalized anxiety disorder: Secondary | ICD-10-CM | POA: Diagnosis not present

## 2022-01-26 DIAGNOSIS — F4310 Post-traumatic stress disorder, unspecified: Secondary | ICD-10-CM | POA: Diagnosis not present

## 2022-01-26 DIAGNOSIS — F321 Major depressive disorder, single episode, moderate: Secondary | ICD-10-CM | POA: Diagnosis not present

## 2022-01-26 MED ORDER — ALPRAZOLAM 0.5 MG PO TABS: 0.5000 mg | ORAL_TABLET | Freq: Every evening | ORAL | 1 refills | Status: DC | PRN

## 2022-01-26 MED ORDER — DULOXETINE HCL 60 MG PO CPEP: 60.0000 mg | ORAL_CAPSULE | Freq: Two times a day (BID) | ORAL | 1 refills | Status: DC

## 2022-01-26 MED ORDER — BUPROPION HCL ER (SR) 150 MG PO TB12: ORAL_TABLET | ORAL | 1 refills | Status: DC

## 2022-01-26 MED ORDER — BREXPIPRAZOLE 1 MG PO TABS
1.5000 mg | ORAL_TABLET | Freq: Every day | ORAL | 1 refills | Status: DC
Start: 2022-01-26 — End: 2022-01-29

## 2022-01-26 NOTE — Progress Notes (Signed)
[facility] Follow up visit  Patient Identification: Jessica Vaughn MRN:  [id number] Date of Evaluation:  01/26/2022 Referral Source: [facility] Chief Complaint:  follow up depression, irregular sleep Visit Diagnosis:    ICD-10-CM   1. Current moderate episode of major depressive disorder without prior episode (HCC)  F32.1     2. GAD (generalized anxiety disorder)  F41.1     3. PTSD (post-traumatic stress disorder)  F43.10      Virtual Visit via Video Note  I connected with Kajuana Harland on 01/26/22 at 12:30 PM EDT by a video enabled telemedicine application and verified that I am speaking with the correct person using two identifiers.  Location: Patient: home Provider: home office   I discussed the limitations of evaluation and management by telemedicine and the availability of in person appointments. The patient expressed understanding and agreed to proceed.    I discussed the assessment and treatment plan with the patient. The patient was provided an opportunity to ask questions and all were answered. The patient agreed with the plan and demonstrated an understanding of the instructions.   The patient was advised to call back or seek an in-person evaluation if the symptoms worsen or if the condition fails to improve as anticipated.  I provided 15 minutes of non-face-to-face time during this encounter including charting and review    History of Present Illness: Patient is a 56 years old single Caucasian female initially  referred by pain clinic for management and to establish care for depression and possible bipolar.  she suffers from chronic pain rheumatoid arthritis   Arthritis effects sleep and mood  She got knee replacement surgery and its challenging but may take few months  Overall mood is fair but surgery has effected ambulation One son going to [employer]  Aggravating factors; co morbid medical conditions, fibromyalgia , past trauma Modifying factors;  boys  Duration since young age Severity fluctuates but not worse  Past Psychiatric History: depression, anxiety   Previous Psychotropic Medications: Yes     Past Medical History:  Past Medical History:  Diagnosis Date   Anxiety    Arthritis    COPD (chronic obstructive pulmonary disease) (HCC)    Depression    Neuromuscular disorder (HCC)    Osteoporosis    Seizures (HCC)    Sleep apnea    Ulcer     Past Surgical History:  Procedure Laterality Date   ROTATOR CUFF REPAIR Left 2009    Family Psychiatric History: mom : depression  Family History:  Family History  Problem Relation Age of Onset   Depression Mother    Cancer Father    Diabetes Father    Hypertension Father    Drug abuse Sister    Colon cancer Neg Hx     Social History:   Social History   Socioeconomic History   Marital status: Divorced    Spouse name: Not on file   Number of children: 2   Years of education: Not on file   Highest education level: Not on file  Occupational History   Not on file  Tobacco Use   Smoking status: Every Day    Packs/day: 0.50    Types: Cigarettes   Smokeless tobacco: Never  Vaping Use   Vaping Use: Never used  Substance and Sexual Activity   Alcohol use: No   Drug use: No   Sexual activity: Yes    Birth control/protection: Post-menopausal  Other Topics Concern   Not on file  Social  History Narrative   Lives with 2 sons, 1 is disabled. Moved her recently for [place].    Social Determinants of Health   Financial Resource Strain: Not on file  Food Insecurity: Not on file  Transportation Needs: Not on file  Physical Activity: Not on file  Stress: Not on file  Social Connections: Not on file      Allergies:   Allergies  Allergen Reactions   Meloxicam Other (See Comments)    Interacts with other meds   Tramadol Other (See Comments)    Interacts with other meds   Robaxin [Methocarbamol]    Zanaflex [Tizanidine Hcl]     Metabolic Disorder  Labs: Lab Results  Component Value Date   HGBA1C 5.5 11/06/2016   No results found for: "PROLACTIN" Lab Results  Component Value Date   CHOL 238 (H) 11/06/2016   TRIG 144 11/06/2016   HDL 52 11/06/2016   CHOLHDL 4.6 (H) 11/06/2016   LDLCALC 157 (H) 11/06/2016   Lab Results  Component Value Date   TSH 1.740 11/13/2016    Therapeutic Level Labs: No results found for: "LITHIUM" No results found for: "CBMZ" No results found for: "VALPROATE"  Current Medications: Current Outpatient Medications  Medication Sig Dispense Refill   albuterol (PROVENTIL HFA;VENTOLIN HFA) 108 (90 Base) MCG/ACT inhaler Inhale 1 puff into the lungs every 6 (six) hours as needed for wheezing or shortness of breath. 1 Inhaler 6   ALPRAZolam (XANAX) 0.5 MG tablet Take 1 tablet (0.5 mg total) by mouth at bedtime as needed. 30 tablet 1   AMITIZA 24 MCG capsule TAKE 1 CAPSULE BY MOUTH TWICE DAILY WITH MEALS 60 capsule 5   atorvastatin (LIPITOR) 40 MG tablet Take 1 tablet (40 mg total) by mouth daily. 90 tablet 3   baclofen (LIORESAL) 10 MG tablet Take 10 mg by mouth 3 (three) times daily.     brexpiprazole (REXULTI) 1 MG TABS tablet Take 1.5 tablets (1.5 mg total) by mouth at bedtime. 30 tablet 1   buPROPion (WELLBUTRIN SR) 150 MG 12 hr tablet SMARTSIG:1 Tablet(s) By Mouth Every 12 Hours 30 tablet 1   cholecalciferol (VITAMIN D) 1000 units tablet Take 1 tablet (1,000 Units total) by mouth daily. 30 tablet 5   DULoxetine (CYMBALTA) 60 MG capsule Take 1 capsule (60 mg total) by mouth 2 (two) times daily. 60 capsule 1   eletriptan (RELPAX) 40 MG tablet TAKE ONE TABLET BY MOUTH AT EARLIEST ONSET OF MIGRAINE, MAY REPEAT ONCE IN 2 HOURS IF HEADACHE PERSISTS OR RECURS 9 tablet 3   Fluticasone-Salmeterol (ADVAIR) 250-50 MCG/DOSE AEPB Inhale 1 puff into the lungs 2 (two) times daily. 60 each 5   folic acid (FOLVITE) 1 MG tablet Take 1 tablet (1 mg total) by mouth daily. 30 tablet 5   furosemide (LASIX) 40 MG tablet Take 1  tablet (40 mg total) by mouth daily. 30 tablet 5   HYDROcodone-acetaminophen (NORCO/VICODIN) 5-325 MG tablet Take by mouth.     hydrocortisone (ANUSOL-HC) 2.5 % rectal cream Place 1 application rectally 2 (two) times daily. 30 g 1   ibuprofen (ADVIL,MOTRIN) 800 MG tablet TAKE 1 TABLET BY MOUTH EVERY 8 HOURS AS NEEDED 90 tablet 2   ibuprofen (ADVIL,MOTRIN) 800 MG tablet Take 1 tablet (800 mg total) by mouth every 8 (eight) hours as needed. 90 tablet 3   lamoTRIgine (LAMICTAL) 200 MG tablet Take 1 tablet (200 mg total) by mouth 2 (two) times daily. 180 tablet 0   metFORMIN (GLUCOPHAGE) 500 MG tablet  Take 1 tablet (500 mg total) by mouth 2 (two) times daily with a meal. 60 tablet 1   methocarbamol (ROBAXIN) 500 MG tablet Take 1 tablet (500 mg total) by mouth every 8 (eight) hours as needed for muscle spasms. 30 tablet 1   methotrexate 2.5 MG tablet TAKE 8 TABLETS BY MOUTH ONCE A WEEK 32 tablet 5   naproxen (NAPROSYN) 500 MG tablet Take one with every sumatriptan dose 60 tablet 1   pantoprazole (PROTONIX) 40 MG tablet TAKE 1 TABLET BY MOUTH ONCE DAILY 30 tablet 5   polyethylene glycol-electrolytes (TRILYTE) 420 g solution Take 4,000 mLs by mouth as directed. 4000 mL 0   prazosin (MINIPRESS) 2 MG capsule Take 1 capsule (2 mg total) by mouth at bedtime. 30 capsule 1   promethazine (PHENERGAN) 25 MG tablet Take 1 tablet (25 mg total) by mouth every 6 (six) hours as needed for nausea or vomiting. 30 tablet 0   propranolol ER (INDERAL LA) 60 MG 24 hr capsule Take 1 capsule (60 mg total) by mouth daily. 30 capsule 2   SUMAtriptan (IMITREX) 100 MG tablet Take 1 tablet (100 mg total) by mouth once as needed for migraine. May repeat in 2 hours if headache persists or recurs. 10 tablet 2   vitamin B-12 (CYANOCOBALAMIN) 1000 MCG tablet Take 500 mcg by mouth daily.     Vitamin D, Ergocalciferol, (DRISDOL) 50000 units CAPS capsule TAKE 1 CAPSULE BY MOUTH TWICE A WEEK 16 capsule 0   No current facility-administered  medications for this visit.      Psychiatric Specialty Exam: Review of Systems  Cardiovascular:  Negative for chest pain.  Skin:  Negative for rash.  Neurological:  Negative for tremors.  Psychiatric/Behavioral:  Negative for agitation, hallucinations and self-injury.     There were no vitals taken for this visit.There is no height or weight on file to calculate BMI.  General Appearance: Casual  Eye Contact:  Fair  Speech:  Clear and Coherent  Volume:  Normal  Mood: fair  Affect:  Congruent  Thought Process:  Goal Directed  Orientation:  Full (Time, Place, and Person)  Thought Content:  Rumination  Suicidal Thoughts:  No  Homicidal Thoughts:  No  Memory:  Immediate;   Fair Recent;   Fair  Judgement:  Fair  Insight:  Fair  Psychomotor Activity:  Decreased  Concentration:  Concentration: Fair and Attention Span: Fair  Recall:  [employer] of Knowledge:Good  Language: Good  Akathisia:  No  Handed:   AIMS (if indicated):  not done  Assets:  Desire for Improvement Social Support  ADL's:  Intact  Cognition: WNL  Sleep:  Fair to variable decreased   Screenings: Mini-Mental    Flowsheet Row Office Visit from 11/13/2016 in [facility]  Total Score (max 30 points ) 15      PHQ2-9    Flowsheet Row Counselor from 08/22/2020 in [place] Video Visit from 06/30/2020 in [place] Office Visit from 11/13/2016 in [facility] Office Visit from 11/06/2016 in [facility] Office Visit from 10/04/2016 in [facility]  PHQ-2 Total Score 2 2 6 6  0  PHQ-9 Total Score 16 8 24 24  --      Flowsheet Row Video Visit from 01/26/2022 in [facility] Video Visit from 10/27/2021 in [facility] Video Visit from 07/13/2021 in [place]  [facility] [place]  C-SSRS RISK CATEGORY No Risk No Risk No Risk       Assessment and Plan: as follows  Prior documentation reviewed    Mood disorder possible bipolar disorder, depressed: partial remission, : fair on cymbalta, rexulti, reviewed side effects No headaches, chest pain  GAD: manageable continue cymbalta Knee surgery causing stress but remains optimistic Refills sent which were due on small dose xanax as well for sleep and taking xanax regularly for sleep and anxiety  PTSD: avoids triggers, works on distraction, continue cymbalta and therapy  Meds renewed Fu 67m.  Thresa Ross, MD 10/6/202312:34 PM

## 2022-01-29 ENCOUNTER — Other Ambulatory Visit (HOSPITAL_COMMUNITY): Payer: Self-pay

## 2022-01-29 ENCOUNTER — Telehealth (HOSPITAL_COMMUNITY): Payer: Self-pay

## 2022-01-29 MED ORDER — BREXPIPRAZOLE 1 MG PO TABS: 1.5000 mg | ORAL_TABLET | Freq: Every day | ORAL | 1 refills | Status: DC

## 2022-01-29 NOTE — Telephone Encounter (Signed)
Prior Authorization done for Rexulti 1mg  Approved until 04/23/2023 thru [facility] notified by fax

## 2022-04-10 ENCOUNTER — Telehealth (HOSPITAL_COMMUNITY): Payer: Self-pay | Admitting: Psychiatry

## 2022-04-10 MED ORDER — ALPRAZOLAM 0.5 MG PO TABS: 0.5000 mg | ORAL_TABLET | Freq: Every evening | ORAL | 1 refills | Status: DC | PRN

## 2022-04-10 NOTE — Telephone Encounter (Signed)
Patient called requesting refill of: ALPRAZolam Prudy Feeler) 0.5 MG tablet   [facility] [address] [facility] [place] [facility] [place] [address] (Ph: [phone])   Last ordered: 01/26/2022 30 tablets with one refill  Last visit: 01/26/2022  Next visit: 05/04/2022

## 2022-05-04 ENCOUNTER — Encounter (HOSPITAL_COMMUNITY): Payer: Self-pay | Admitting: Psychiatry

## 2022-05-04 ENCOUNTER — Telehealth (INDEPENDENT_AMBULATORY_CARE_PROVIDER_SITE_OTHER): Payer: 59 | Admitting: Psychiatry

## 2022-05-04 DIAGNOSIS — F411 Generalized anxiety disorder: Secondary | ICD-10-CM | POA: Diagnosis not present

## 2022-05-04 DIAGNOSIS — F4310 Post-traumatic stress disorder, unspecified: Secondary | ICD-10-CM | POA: Diagnosis not present

## 2022-05-04 DIAGNOSIS — F321 Major depressive disorder, single episode, moderate: Secondary | ICD-10-CM | POA: Diagnosis not present

## 2022-05-04 MED ORDER — BREXPIPRAZOLE 1 MG PO TABS: 1.5000 mg | ORAL_TABLET | Freq: Every day | ORAL | 1 refills | Status: DC

## 2022-05-04 MED ORDER — BUPROPION HCL ER (SR) 150 MG PO TB12: ORAL_TABLET | ORAL | 1 refills | Status: DC

## 2022-05-04 MED ORDER — DULOXETINE HCL 60 MG PO CPEP: 60.0000 mg | ORAL_CAPSULE | Freq: Two times a day (BID) | ORAL | 1 refills | Status: DC

## 2022-05-04 NOTE — Progress Notes (Signed)
[facility] Follow up visit  Patient Identification: Jessica Vaughn MRN:  [id number] Date of Evaluation:  05/04/2022 Referral Source: [facility] Chief Complaint:  follow up depression, irregular sleep Visit Diagnosis:    ICD-10-CM   1. Current moderate episode of major depressive disorder without prior episode (HCC)  F32.1     2. PTSD (post-traumatic stress disorder)  F43.10     3. GAD (generalized anxiety disorder)  F41.1      Virtual Visit via Video Note  I connected with Tatijana Jepsen on 05/04/22 at 12:30 PM EST by a video enabled telemedicine application and verified that I am speaking with the correct person using two identifiers.  Location: Patient: home Provider:home office   I discussed the limitations of evaluation and management by telemedicine and the availability of in person appointments. The patient expressed understanding and agreed to proceed.    I discussed the assessment and treatment plan with the patient. The patient was provided an opportunity to ask questions and all were answered. The patient agreed with the plan and demonstrated an understanding of the instructions.   The patient was advised to call back or seek an in-person evaluation if the symptoms worsen or if the condition fails to improve as anticipated.  I provided  15 - 20 minutes of non-face-to-face time during this encounter including charting and review    History of Present Illness: Patient is a 57 years old single Caucasian female initially  referred by pain clinic for management and to establish care for depression and possible bipolar.  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 and pain  Son joining [employer], she is excited of that  Aggravating  factors; co morbid medical conditions, fibromyalgia Modifying factors; boys  Duration since young age Severity fluctuates but not worse  Past Psychiatric History: depression, anxiety   Previous Psychotropic Medications: Yes     Past Medical History:  Past Medical History:  Diagnosis Date   Anxiety    Arthritis    COPD (chronic obstructive pulmonary disease) (HCC)    Depression    Neuromuscular disorder (HCC)    Osteoporosis    Seizures (HCC)    Sleep apnea    Ulcer     Past Surgical History:  Procedure Laterality Date   ROTATOR CUFF REPAIR Left 2009    Family Psychiatric History: mom : depression  Family History:  Family History  Problem Relation Age of Onset   Depression Mother    Cancer Father    Diabetes Father    Hypertension Father    Drug abuse Sister    Colon cancer Neg Hx     Social History:   Social History   Socioeconomic History   Marital status: Divorced    Spouse name: Not on file   Number of children: 2   Years of education: Not on file   Highest education level: Not on file  Occupational History   Not on file  Tobacco Use   Smoking status: Every Day    Packs/day: 0.50    Types: Cigarettes   Smokeless tobacco: Never  Vaping Use   Vaping Use: Never used  Substance and Sexual Activity   Alcohol use: No   Drug use: No   Sexual activity: Yes    Birth  control/protection: Post-menopausal  Other Topics Concern   Not on file  Social History Narrative   Lives with 2 sons, 1 is disabled. Moved her recently for [place].    Social Determinants of Health   Financial Resource Strain: Not on file  Food Insecurity: Not on file  Transportation Needs: Not on file  Physical Activity: Not on file  Stress: Not on file  Social Connections: Not on file      Allergies:   Allergies  Allergen Reactions   Meloxicam Other (See Comments)    Interacts with other meds   Tramadol Other (See Comments)    Interacts with other meds   Robaxin  [Methocarbamol]    Zanaflex [Tizanidine Hcl]     Metabolic Disorder Labs: Lab Results  Component Value Date   HGBA1C 5.5 11/06/2016   No results found for: "PROLACTIN" Lab Results  Component Value Date   CHOL 238 (H) 11/06/2016   TRIG 144 11/06/2016   HDL 52 11/06/2016   CHOLHDL 4.6 (H) 11/06/2016   LDLCALC 157 (H) 11/06/2016   Lab Results  Component Value Date   TSH 1.740 11/13/2016    Therapeutic Level Labs: No results found for: "LITHIUM" No results found for: "CBMZ" No results found for: "VALPROATE"  Current Medications: Current Outpatient Medications  Medication Sig Dispense Refill   albuterol (PROVENTIL HFA;VENTOLIN HFA) 108 (90 Base) MCG/ACT inhaler Inhale 1 puff into the lungs every 6 (six) hours as needed for wheezing or shortness of breath. 1 Inhaler 6   ALPRAZolam (XANAX) 0.5 MG tablet Take 1 tablet (0.5 mg total) by mouth at bedtime as needed. 30 tablet 1   AMITIZA 24 MCG capsule TAKE 1 CAPSULE BY MOUTH TWICE DAILY WITH MEALS 60 capsule 5   atorvastatin (LIPITOR) 40 MG tablet Take 1 tablet (40 mg total) by mouth daily. 90 tablet 3   baclofen (LIORESAL) 10 MG tablet Take 10 mg by mouth 3 (three) times daily.     brexpiprazole (REXULTI) 1 MG TABS tablet Take 1.5 tablets (1.5 mg total) by mouth at bedtime. 45 tablet 1   buPROPion (WELLBUTRIN SR) 150 MG 12 hr tablet SMARTSIG:1 Tablet(s) By Mouth Every 12 Hours 30 tablet 1   cholecalciferol (VITAMIN D) 1000 units tablet Take 1 tablet (1,000 Units total) by mouth daily. 30 tablet 5   DULoxetine (CYMBALTA) 60 MG capsule Take 1 capsule (60 mg total) by mouth 2 (two) times daily. 60 capsule 1   eletriptan (RELPAX) 40 MG tablet TAKE ONE TABLET BY MOUTH AT EARLIEST ONSET OF MIGRAINE, MAY REPEAT ONCE IN 2 HOURS IF HEADACHE PERSISTS OR RECURS 9 tablet 3   Fluticasone-Salmeterol (ADVAIR) 250-50 MCG/DOSE AEPB Inhale 1 puff into the lungs 2 (two) times daily. 60 each 5   folic acid (FOLVITE) 1 MG tablet Take 1 tablet (1 mg  total) by mouth daily. 30 tablet 5   furosemide (LASIX) 40 MG tablet Take 1 tablet (40 mg total) by mouth daily. 30 tablet 5   HYDROcodone-acetaminophen (NORCO/VICODIN) 5-325 MG tablet Take by mouth.     hydrocortisone (ANUSOL-HC) 2.5 % rectal cream Place 1 application rectally 2 (two) times daily. 30 g 1   ibuprofen (ADVIL,MOTRIN) 800 MG tablet TAKE 1 TABLET BY MOUTH EVERY 8 HOURS AS NEEDED 90 tablet 2   ibuprofen (ADVIL,MOTRIN) 800 MG tablet Take 1 tablet (800 mg total) by mouth every 8 (eight) hours as needed. 90 tablet 3   lamoTRIgine (LAMICTAL) 200 MG tablet Take 1 tablet (200 mg total) by mouth 2 (  two) times daily. 180 tablet 0   metFORMIN (GLUCOPHAGE) 500 MG tablet Take 1 tablet (500 mg total) by mouth 2 (two) times daily with a meal. 60 tablet 1   methocarbamol (ROBAXIN) 500 MG tablet Take 1 tablet (500 mg total) by mouth every 8 (eight) hours as needed for muscle spasms. 30 tablet 1   methotrexate 2.5 MG tablet TAKE 8 TABLETS BY MOUTH ONCE A WEEK 32 tablet 5   naproxen (NAPROSYN) 500 MG tablet Take one with every sumatriptan dose 60 tablet 1   pantoprazole (PROTONIX) 40 MG tablet TAKE 1 TABLET BY MOUTH ONCE DAILY 30 tablet 5   polyethylene glycol-electrolytes (TRILYTE) 420 g solution Take 4,000 mLs by mouth as directed. 4000 mL 0   prazosin (MINIPRESS) 2 MG capsule Take 1 capsule (2 mg total) by mouth at bedtime. 30 capsule 1   promethazine (PHENERGAN) 25 MG tablet Take 1 tablet (25 mg total) by mouth every 6 (six) hours as needed for nausea or vomiting. 30 tablet 0   propranolol ER (INDERAL LA) 60 MG 24 hr capsule Take 1 capsule (60 mg total) by mouth daily. 30 capsule 2   SUMAtriptan (IMITREX) 100 MG tablet Take 1 tablet (100 mg total) by mouth once as needed for migraine. May repeat in 2 hours if headache persists or recurs. 10 tablet 2   vitamin B-12 (CYANOCOBALAMIN) 1000 MCG tablet Take 500 mcg by mouth daily.     Vitamin D, Ergocalciferol, (DRISDOL) 50000 units CAPS capsule TAKE 1  CAPSULE BY MOUTH TWICE A WEEK 16 capsule 0   No current facility-administered medications for this visit.      Psychiatric Specialty Exam: Review of Systems  Cardiovascular:  Negative for chest pain.  Musculoskeletal:  Positive for arthralgias and myalgias.  Skin:  Negative for rash.  Neurological:  Negative for tremors.  Psychiatric/Behavioral:  Negative for agitation, hallucinations and self-injury.     There were no vitals taken for this visit.There is no height or weight on file to calculate BMI.  General Appearance: Casual  Eye Contact:  Fair  Speech:  Clear and Coherent  Volume:  Normal  Mood: fair  Affect:  Congruent  Thought Process:  Goal Directed  Orientation:  Full (Time, Place, and Person)  Thought Content:  Rumination  Suicidal Thoughts:  No  Homicidal Thoughts:  No  Memory:  Immediate;   Fair Recent;   Fair  Judgement:  Fair  Insight:  Fair  Psychomotor Activity:  Decreased  Concentration:  Concentration: Fair and Attention Span: Fair  Recall:  [employer] of Knowledge:Good  Language: Good  Akathisia:  No  Handed:   AIMS (if indicated):  not done  Assets:  Desire for Improvement Social Support  ADL's:  Intact  Cognition: WNL  Sleep:  Fair to variable decreased   Screenings: Mini-Mental    Flowsheet Row Office Visit from 11/13/2016 in [facility]  Total Score (max 30 points ) 15      PHQ2-9    Flowsheet Row Counselor from 08/22/2020 in [facility] [place] Video Visit from 06/30/2020 in [facility] [place] Office Visit from 11/13/2016 in [facility] Office Visit from 11/06/2016 in [facility] Office Visit from 10/04/2016 in [facility]  PHQ-2 Total Score 2 2 6 6  0  PHQ-9 Total Score 16 8 24 24  --      Flowsheet Row Video Visit from 01/26/2022 in [facility] [place]  Video Visit from 10/27/2021 in [place] Video Visit from 07/13/2021 in [place] No Risk No Risk No Risk       Assessment and Plan: as follows  Prior documentation reviewed    Mood disorder possible bipolar disorder, depressed: partial remission, :manageable on rexulti, cymbalta  No headaches, chest pain  GAD: fair, wants to be more movile, looking forward to ketamine infusion, continue cymbalta   PTSD:avoids triggers, continue cymbalta  Meds renewed which were due Fu 46m.  Merian Capron, MD 1/12/202412:35 PM

## 2022-05-28 ENCOUNTER — Telehealth (HOSPITAL_COMMUNITY): Payer: Self-pay | Admitting: *Deleted

## 2022-05-28 NOTE — Telephone Encounter (Signed)
PATIENT HAS MEDIC ARE PART D BENEFITS   brexpiprazole (REXULTI) 1 MG TABS tablet  COVERED UNDER PART D BENEFITS  Rx TO CALL TO EXPLAIN

## 2022-06-01 DIAGNOSIS — M5030 Other cervical disc degeneration, unspecified cervical region: Secondary | ICD-10-CM | POA: Diagnosis not present

## 2022-06-01 DIAGNOSIS — E119 Type 2 diabetes mellitus without complications: Secondary | ICD-10-CM | POA: Diagnosis not present

## 2022-06-01 DIAGNOSIS — Z79899 Other long term (current) drug therapy: Secondary | ICD-10-CM | POA: Diagnosis not present

## 2022-06-01 DIAGNOSIS — M2550 Pain in unspecified joint: Secondary | ICD-10-CM | POA: Diagnosis not present

## 2022-06-01 DIAGNOSIS — E782 Mixed hyperlipidemia: Secondary | ICD-10-CM | POA: Diagnosis not present

## 2022-06-01 DIAGNOSIS — M069 Rheumatoid arthritis, unspecified: Secondary | ICD-10-CM | POA: Diagnosis not present

## 2022-06-01 DIAGNOSIS — M25562 Pain in left knee: Secondary | ICD-10-CM | POA: Diagnosis not present

## 2022-06-01 DIAGNOSIS — M542 Cervicalgia: Secondary | ICD-10-CM | POA: Diagnosis not present

## 2022-06-01 DIAGNOSIS — M797 Fibromyalgia: Secondary | ICD-10-CM | POA: Diagnosis not present

## 2022-06-01 DIAGNOSIS — G43909 Migraine, unspecified, not intractable, without status migrainosus: Secondary | ICD-10-CM | POA: Diagnosis not present

## 2022-06-01 DIAGNOSIS — J449 Chronic obstructive pulmonary disease, unspecified: Secondary | ICD-10-CM | POA: Diagnosis not present

## 2022-06-06 DIAGNOSIS — H524 Presbyopia: Secondary | ICD-10-CM | POA: Diagnosis not present

## 2022-06-06 DIAGNOSIS — H5203 Hypermetropia, bilateral: Secondary | ICD-10-CM | POA: Diagnosis not present

## 2022-06-12 ENCOUNTER — Other Ambulatory Visit (HOSPITAL_COMMUNITY): Payer: Self-pay | Admitting: Psychiatry

## 2022-06-12 ENCOUNTER — Telehealth (HOSPITAL_COMMUNITY): Payer: Self-pay | Admitting: *Deleted

## 2022-06-12 DIAGNOSIS — J0100 Acute maxillary sinusitis, unspecified: Secondary | ICD-10-CM | POA: Diagnosis not present

## 2022-06-12 DIAGNOSIS — H9201 Otalgia, right ear: Secondary | ICD-10-CM | POA: Diagnosis not present

## 2022-06-12 NOTE — Telephone Encounter (Signed)
PATIENT REQUEST REFILL -- ALPRAZolam Duanne Moron) 0.5 MG tablet  [place], [place] - [address]   NEXT VISIT   09/07/22 LAST VISIT     05/04/22

## 2022-07-09 DIAGNOSIS — Z96652 Presence of left artificial knee joint: Secondary | ICD-10-CM | POA: Diagnosis not present

## 2022-07-09 DIAGNOSIS — Z471 Aftercare following joint replacement surgery: Secondary | ICD-10-CM | POA: Diagnosis not present

## 2022-07-09 DIAGNOSIS — J449 Chronic obstructive pulmonary disease, unspecified: Secondary | ICD-10-CM | POA: Diagnosis not present

## 2022-07-23 DIAGNOSIS — Z79899 Other long term (current) drug therapy: Secondary | ICD-10-CM | POA: Diagnosis not present

## 2022-07-23 DIAGNOSIS — M25562 Pain in left knee: Secondary | ICD-10-CM | POA: Diagnosis not present

## 2022-07-23 DIAGNOSIS — M542 Cervicalgia: Secondary | ICD-10-CM | POA: Diagnosis not present

## 2022-07-23 DIAGNOSIS — G43909 Migraine, unspecified, not intractable, without status migrainosus: Secondary | ICD-10-CM | POA: Diagnosis not present

## 2022-07-23 DIAGNOSIS — E559 Vitamin D deficiency, unspecified: Secondary | ICD-10-CM | POA: Diagnosis not present

## 2022-07-23 DIAGNOSIS — E119 Type 2 diabetes mellitus without complications: Secondary | ICD-10-CM | POA: Diagnosis not present

## 2022-07-23 DIAGNOSIS — M2550 Pain in unspecified joint: Secondary | ICD-10-CM | POA: Diagnosis not present

## 2022-07-23 DIAGNOSIS — M5030 Other cervical disc degeneration, unspecified cervical region: Secondary | ICD-10-CM | POA: Diagnosis not present

## 2022-07-23 DIAGNOSIS — E782 Mixed hyperlipidemia: Secondary | ICD-10-CM | POA: Diagnosis not present

## 2022-07-23 DIAGNOSIS — M069 Rheumatoid arthritis, unspecified: Secondary | ICD-10-CM | POA: Diagnosis not present

## 2022-08-08 ENCOUNTER — Telehealth (HOSPITAL_COMMUNITY): Payer: Self-pay | Admitting: Psychiatry

## 2022-08-08 MED ORDER — ALPRAZOLAM 0.5 MG PO TABS: 0.5000 mg | ORAL_TABLET | Freq: Every evening | ORAL | 0 refills | Status: DC | PRN

## 2022-08-08 NOTE — Telephone Encounter (Signed)
Patient called requesting refill of:  ALPRAZolam Prudy Feeler) 0.5 MG tablet   [facility] [place], [place] - [place] [address] (Ph: [phone])   Last ordered: 06/12/2022 - 30 tablets with 1 refill  Last visit: 05/04/2022  Next visit: 09/07/2022

## 2022-08-08 NOTE — Telephone Encounter (Signed)
Done

## 2022-08-09 NOTE — Telephone Encounter (Signed)
Medication management - Telephone message left for patient on her identifiable voicemail that Dr. Lolly Mustache, filling in for Dr. Lynnae Sandhoff 08/08/22 had sent in her requested new Alprazolam 0.5 mg order to her [facility] in [place], [place].

## 2022-08-10 DIAGNOSIS — Z79899 Other long term (current) drug therapy: Secondary | ICD-10-CM | POA: Diagnosis not present

## 2022-08-10 DIAGNOSIS — Z6835 Body mass index (BMI) 35.0-35.9, adult: Secondary | ICD-10-CM | POA: Diagnosis not present

## 2022-08-10 DIAGNOSIS — Z3200 Encounter for pregnancy test, result unknown: Secondary | ICD-10-CM | POA: Diagnosis not present

## 2022-08-10 DIAGNOSIS — R635 Abnormal weight gain: Secondary | ICD-10-CM | POA: Diagnosis not present

## 2022-08-10 DIAGNOSIS — R5383 Other fatigue: Secondary | ICD-10-CM | POA: Diagnosis not present

## 2022-08-10 DIAGNOSIS — R0602 Shortness of breath: Secondary | ICD-10-CM | POA: Diagnosis not present

## 2022-08-10 DIAGNOSIS — D539 Nutritional anemia, unspecified: Secondary | ICD-10-CM | POA: Diagnosis not present

## 2022-08-10 DIAGNOSIS — E88819 Insulin resistance, unspecified: Secondary | ICD-10-CM | POA: Diagnosis not present

## 2022-08-10 DIAGNOSIS — Z1159 Encounter for screening for other viral diseases: Secondary | ICD-10-CM | POA: Diagnosis not present

## 2022-08-10 DIAGNOSIS — E119 Type 2 diabetes mellitus without complications: Secondary | ICD-10-CM | POA: Diagnosis not present

## 2022-09-07 ENCOUNTER — Encounter (HOSPITAL_COMMUNITY): Payer: Self-pay | Admitting: Psychiatry

## 2022-09-07 ENCOUNTER — Telehealth (HOSPITAL_COMMUNITY): Payer: Self-pay

## 2022-09-07 ENCOUNTER — Telehealth (INDEPENDENT_AMBULATORY_CARE_PROVIDER_SITE_OTHER): Payer: Medicare HMO | Admitting: Psychiatry

## 2022-09-07 DIAGNOSIS — F321 Major depressive disorder, single episode, moderate: Secondary | ICD-10-CM | POA: Diagnosis not present

## 2022-09-07 DIAGNOSIS — F411 Generalized anxiety disorder: Secondary | ICD-10-CM

## 2022-09-07 DIAGNOSIS — F4310 Post-traumatic stress disorder, unspecified: Secondary | ICD-10-CM | POA: Diagnosis not present

## 2022-09-07 MED ORDER — BUPROPION HCL ER (SR) 150 MG PO TB12: ORAL_TABLET | ORAL | 1 refills | Status: DC

## 2022-09-07 MED ORDER — DULOXETINE HCL 60 MG PO CPEP: 60.0000 mg | ORAL_CAPSULE | Freq: Two times a day (BID) | ORAL | 1 refills | Status: DC

## 2022-09-07 MED ORDER — BREXPIPRAZOLE 1 MG PO TABS: 1.5000 mg | ORAL_TABLET | Freq: Every day | ORAL | 1 refills | Status: DC

## 2022-09-07 MED ORDER — ALPRAZOLAM 0.5 MG PO TABS: 0.5000 mg | ORAL_TABLET | Freq: Every evening | ORAL | 0 refills | Status: DC | PRN

## 2022-09-07 NOTE — Progress Notes (Signed)
[facility] Follow up visit  Patient Identification: Jessica Vaughn MRN:  [id number] Date of Evaluation:  09/07/2022 Referral Source: [facility] Chief Complaint:  follow up depression, irregular sleep Visit Diagnosis:    ICD-10-CM   1. PTSD (post-traumatic stress disorder)  F43.10     2. Current moderate episode of major depressive disorder without prior episode (HCC)  F32.1     3. GAD (generalized anxiety disorder)  F41.1      Virtual Visit via Video Note  I connected with Naia Flahive on 09/07/22 at 11:00 AM EDT by a video enabled telemedicine application and verified that I am speaking with the correct person using two identifiers.  Location: Patient: home Provider: home office   I discussed the limitations of evaluation and management by telemedicine and the availability of in person appointments. The patient expressed understanding and agreed to proceed.      I discussed the assessment and treatment plan with the patient. The patient was provided an opportunity to ask questions and all were answered. The patient agreed with the plan and demonstrated an understanding of the instructions.   The patient was advised to call back or seek an in-person evaluation if the symptoms worsen or if the condition fails to improve as anticipated.  I provided 20 minutes of non-face-to-face time during this encounter.      History of Present Illness: Patient is a 57 years old single Caucasian female initially  referred by pain clinic for management and to establish care for depression and possible bipolar.  she suffers from chronic pain rheumatoid arthritis   Arthritis effects sleep and mood  She may start with ketamine infusion, currently is on pain meds tid   Son leaving for [employer] mixed feelings but happy  Got her license so driving some to get out, over fibro can effect mood and mobility but meds keeping some balance  Aggravating factors; co morbid medical conditions,  fibromyalgia Modifying factors; boys  Duration since young age Severity fluctuates but not worse  Past Psychiatric History: depression, anxiety   Previous Psychotropic Medications: Yes     Past Medical History:  Past Medical History:  Diagnosis Date   Anxiety    Arthritis    COPD (chronic obstructive pulmonary disease) (HCC)    Depression    Neuromuscular disorder (HCC)    Osteoporosis    Seizures (HCC)    Sleep apnea    Ulcer     Past Surgical History:  Procedure Laterality Date   ROTATOR CUFF REPAIR Left 2009    Family Psychiatric History: mom : depression  Family History:  Family History  Problem Relation Age of Onset   Depression Mother    Cancer Father    Diabetes Father    Hypertension Father    Drug abuse Sister    Colon cancer Neg Hx     Social History:   Social History   Socioeconomic History   Marital status: Divorced    Spouse name: Not on file   Number of children: 2   Years of education: Not on file   Highest education level: Not on file  Occupational History   Not on file  Tobacco Use   Smoking status: Every Day    Packs/day: .5    Types: Cigarettes   Smokeless tobacco: Never  Vaping Use   Vaping Use: Never used  Substance and Sexual Activity   Alcohol use: No   Drug use: No   Sexual activity: Yes    Birth  control/protection: Post-menopausal  Other Topics Concern   Not on file  Social History Narrative   Lives with 2 sons, 1 is disabled. Moved her recently for [place].    Social Determinants of Health   Financial Resource Strain: Not on file  Food Insecurity: Not on file  Transportation Needs: Not on file  Physical Activity: Not on file  Stress: Not on file  Social Connections: Not on file      Allergies:   Allergies  Allergen Reactions   Meloxicam Other (See Comments)    Interacts with other meds   Tramadol Other (See Comments)    Interacts with other meds   Robaxin [Methocarbamol]    Zanaflex [Tizanidine  Hcl]     Metabolic Disorder Labs: Lab Results  Component Value Date   HGBA1C 5.5 11/06/2016   No results found for: "PROLACTIN" Lab Results  Component Value Date   CHOL 238 (H) 11/06/2016   TRIG 144 11/06/2016   HDL 52 11/06/2016   CHOLHDL 4.6 (H) 11/06/2016   LDLCALC 157 (H) 11/06/2016   Lab Results  Component Value Date   TSH 1.740 11/13/2016    Therapeutic Level Labs: No results found for: "LITHIUM" No results found for: "CBMZ" No results found for: "VALPROATE"  Current Medications: Current Outpatient Medications  Medication Sig Dispense Refill   albuterol (PROVENTIL HFA;VENTOLIN HFA) 108 (90 Base) MCG/ACT inhaler Inhale 1 puff into the lungs every 6 (six) hours as needed for wheezing or shortness of breath. 1 Inhaler 6   ALPRAZolam (XANAX) 0.5 MG tablet Take 1 tablet (0.5 mg total) by mouth at bedtime as needed. 30 tablet 0   AMITIZA 24 MCG capsule TAKE 1 CAPSULE BY MOUTH TWICE DAILY WITH MEALS 60 capsule 5   atorvastatin (LIPITOR) 40 MG tablet Take 1 tablet (40 mg total) by mouth daily. 90 tablet 3   baclofen (LIORESAL) 10 MG tablet Take 10 mg by mouth 3 (three) times daily.     brexpiprazole (REXULTI) 1 MG TABS tablet Take 1.5 tablets (1.5 mg total) by mouth at bedtime. 45 tablet 1   buPROPion (WELLBUTRIN SR) 150 MG 12 hr tablet SMARTSIG:1 Tablet(s) By Mouth Every 12 Hours 30 tablet 1   cholecalciferol (VITAMIN D) 1000 units tablet Take 1 tablet (1,000 Units total) by mouth daily. 30 tablet 5   DULoxetine (CYMBALTA) 60 MG capsule Take 1 capsule (60 mg total) by mouth 2 (two) times daily. 60 capsule 1   eletriptan (RELPAX) 40 MG tablet TAKE ONE TABLET BY MOUTH AT EARLIEST ONSET OF MIGRAINE, MAY REPEAT ONCE IN 2 HOURS IF HEADACHE PERSISTS OR RECURS 9 tablet 3   Fluticasone-Salmeterol (ADVAIR) 250-50 MCG/DOSE AEPB Inhale 1 puff into the lungs 2 (two) times daily. 60 each 5   folic acid (FOLVITE) 1 MG tablet Take 1 tablet (1 mg total) by mouth daily. 30 tablet 5    furosemide (LASIX) 40 MG tablet Take 1 tablet (40 mg total) by mouth daily. 30 tablet 5   HYDROcodone-acetaminophen (NORCO/VICODIN) 5-325 MG tablet Take by mouth.     hydrocortisone (ANUSOL-HC) 2.5 % rectal cream Place 1 application rectally 2 (two) times daily. 30 g 1   ibuprofen (ADVIL,MOTRIN) 800 MG tablet TAKE 1 TABLET BY MOUTH EVERY 8 HOURS AS NEEDED 90 tablet 2   ibuprofen (ADVIL,MOTRIN) 800 MG tablet Take 1 tablet (800 mg total) by mouth every 8 (eight) hours as needed. 90 tablet 3   lamoTRIgine (LAMICTAL) 200 MG tablet Take 1 tablet (200 mg total) by mouth 2 (  two) times daily. 180 tablet 0   metFORMIN (GLUCOPHAGE) 500 MG tablet Take 1 tablet (500 mg total) by mouth 2 (two) times daily with a meal. 60 tablet 1   methocarbamol (ROBAXIN) 500 MG tablet Take 1 tablet (500 mg total) by mouth every 8 (eight) hours as needed for muscle spasms. 30 tablet 1   methotrexate 2.5 MG tablet TAKE 8 TABLETS BY MOUTH ONCE A WEEK 32 tablet 5   naproxen (NAPROSYN) 500 MG tablet Take one with every sumatriptan dose 60 tablet 1   pantoprazole (PROTONIX) 40 MG tablet TAKE 1 TABLET BY MOUTH ONCE DAILY 30 tablet 5   polyethylene glycol-electrolytes (TRILYTE) 420 g solution Take 4,000 mLs by mouth as directed. 4000 mL 0   promethazine (PHENERGAN) 25 MG tablet Take 1 tablet (25 mg total) by mouth every 6 (six) hours as needed for nausea or vomiting. 30 tablet 0   propranolol ER (INDERAL LA) 60 MG 24 hr capsule Take 1 capsule (60 mg total) by mouth daily. 30 capsule 2   SUMAtriptan (IMITREX) 100 MG tablet Take 1 tablet (100 mg total) by mouth once as needed for migraine. May repeat in 2 hours if headache persists or recurs. 10 tablet 2   vitamin B-12 (CYANOCOBALAMIN) 1000 MCG tablet Take 500 mcg by mouth daily.     Vitamin D, Ergocalciferol, (DRISDOL) 50000 units CAPS capsule TAKE 1 CAPSULE BY MOUTH TWICE A WEEK 16 capsule 0   No current facility-administered medications for this visit.      Psychiatric Specialty  Exam: Review of Systems  Cardiovascular:  Negative for chest pain.  Musculoskeletal:  Positive for arthralgias and myalgias.  Skin:  Negative for rash.  Neurological:  Negative for tremors.  Psychiatric/Behavioral:  Negative for agitation, hallucinations and self-injury.     There were no vitals taken for this visit.There is no height or weight on file to calculate BMI.  General Appearance: Casual  Eye Contact:  Fair  Speech:  Clear and Coherent  Volume:  Normal  Mood: fair  Affect:  Congruent  Thought Process:  Goal Directed  Orientation:  Full (Time, Place, and Person)  Thought Content:  Rumination  Suicidal Thoughts:  No  Homicidal Thoughts:  No  Memory:  Immediate;   Fair Recent;   Fair  Judgement:  Fair  Insight:  Fair  Psychomotor Activity:  Decreased  Concentration:  Concentration: Fair and Attention Span: Fair  Recall:  [employer] of Knowledge:Good  Language: Good  Akathisia:  No  Handed:   AIMS (if indicated):  not done  Assets:  Desire for Improvement Social Support  ADL's:  Intact  Cognition: WNL  Sleep:  Fair to variable decreased   Screenings: Mini-Mental    Flowsheet Row Office Visit from 11/13/2016 in [facility]  Total Score (max 30 points ) 15      PHQ2-9    Flowsheet Row Counselor from 08/22/2020 in [facility] Video Visit from 06/30/2020 in [facility] at [facility] Office Visit from 11/13/2016 in [facility] Office Visit from 11/06/2016 in [facility] Office Visit from 10/04/2016 in [facility]  PHQ-2 Total Score 2 2 6 6  0  PHQ-9 Total Score 16 8 24 24  --      Flowsheet Row Video Visit from 01/26/2022 in [facility] at [facility] Video Visit from 10/27/2021  in [facility]  Outpatient Behavioral Health at [facility] Video Visit from 07/13/2021 in [facility] at [facility]  C-SSRS RISK CATEGORY No Risk No Risk No Risk       Assessment and Plan: as follows  Prior documentation reviewed    Mood disorder possible bipolar disorder, depressed: fair continue meds, cymbalta, wellbutrin rexulti  No headaches, chest pain  GAD: managing it continue xanax prn, cymbalta and distractions from worries  PTSD:avoids triggers, continue cymbalta  Meds renewed which were due Fu 11m.  Thresa Ross, MD 5/17/202411:11 AM

## 2022-09-07 NOTE — Telephone Encounter (Signed)
Medication management - Prior authorization for patient's prescribed contiuation of Rexulti 1 mg, 1.5 tablets daily, #45 for 30 days completed online with CoverMyMeds and sent to pt's [facility] for review. Prior authorization pending.

## 2022-09-13 ENCOUNTER — Telehealth (HOSPITAL_COMMUNITY): Payer: Self-pay | Admitting: *Deleted

## 2022-09-13 NOTE — Telephone Encounter (Signed)
[employer] [facility] APPROVED PRESCRIPTION DRUG COVERAGE:: brexpiprazole (REXULTI) 1 MG TABS tablet   AUTHORIZATION IS GOOD UNTIL 04/23/2023  Rx NOTIFIED & PATIENT HAS PICKED UP  SCRIPT

## 2022-09-14 DIAGNOSIS — E782 Mixed hyperlipidemia: Secondary | ICD-10-CM | POA: Diagnosis not present

## 2022-09-14 DIAGNOSIS — G43909 Migraine, unspecified, not intractable, without status migrainosus: Secondary | ICD-10-CM | POA: Diagnosis not present

## 2022-09-14 DIAGNOSIS — M069 Rheumatoid arthritis, unspecified: Secondary | ICD-10-CM | POA: Diagnosis not present

## 2022-09-14 DIAGNOSIS — Z79899 Other long term (current) drug therapy: Secondary | ICD-10-CM | POA: Diagnosis not present

## 2022-09-14 DIAGNOSIS — E119 Type 2 diabetes mellitus without complications: Secondary | ICD-10-CM | POA: Diagnosis not present

## 2022-09-14 DIAGNOSIS — G8929 Other chronic pain: Secondary | ICD-10-CM | POA: Diagnosis not present

## 2022-09-14 DIAGNOSIS — M797 Fibromyalgia: Secondary | ICD-10-CM | POA: Diagnosis not present

## 2022-09-14 DIAGNOSIS — E039 Hypothyroidism, unspecified: Secondary | ICD-10-CM | POA: Diagnosis not present

## 2022-09-14 DIAGNOSIS — M5442 Lumbago with sciatica, left side: Secondary | ICD-10-CM | POA: Diagnosis not present

## 2022-09-14 DIAGNOSIS — M2550 Pain in unspecified joint: Secondary | ICD-10-CM | POA: Diagnosis not present

## 2022-09-17 DIAGNOSIS — Z6833 Body mass index (BMI) 33.0-33.9, adult: Secondary | ICD-10-CM | POA: Diagnosis not present

## 2022-09-17 DIAGNOSIS — E119 Type 2 diabetes mellitus without complications: Secondary | ICD-10-CM | POA: Diagnosis not present

## 2022-09-17 DIAGNOSIS — E6609 Other obesity due to excess calories: Secondary | ICD-10-CM | POA: Diagnosis not present

## 2022-09-19 ENCOUNTER — Telehealth (HOSPITAL_COMMUNITY): Payer: Self-pay | Admitting: *Deleted

## 2022-09-19 NOTE — Telephone Encounter (Signed)
LVM FOR PATIENT CONCERNING FAX FROM [employer] THAT THEY HAVE REACHED OUT TO HER TO PROVIDE THE ANNUAL COMPREHENSIVE MEDICATION REVIEW(CMR). WHICH IS REQUIRED BY THE CENTERS FOR MEDICARE & MEDICAID SERVICES.  PATIENT HAS CALLED WHILE CHARTING THIS NOTE & STATED THAT SHE SPOKE WITH A GENTLEMAN OVER TH PHONE LAST MONTH FROM CMR THAT ASKED HER SOME QUESTIONS

## 2022-10-08 ENCOUNTER — Telehealth (HOSPITAL_COMMUNITY): Payer: Self-pay | Admitting: *Deleted

## 2022-10-08 ENCOUNTER — Other Ambulatory Visit (HOSPITAL_COMMUNITY): Payer: Self-pay | Admitting: Psychiatry

## 2022-10-08 NOTE — Telephone Encounter (Signed)
Patient request-- ALPRAZolam (XANAX) 0.5 MG tablet  Take 1 tablet (0.5 mg total) by mouth at bedtime as needed  .: [facility] [address] [place], [place] - [place] [address]   Last appt 09/07/22 Next appt 12/05/22

## 2022-10-30 DIAGNOSIS — E782 Mixed hyperlipidemia: Secondary | ICD-10-CM | POA: Diagnosis not present

## 2022-10-30 DIAGNOSIS — M069 Rheumatoid arthritis, unspecified: Secondary | ICD-10-CM | POA: Diagnosis not present

## 2022-10-30 DIAGNOSIS — M5442 Lumbago with sciatica, left side: Secondary | ICD-10-CM | POA: Diagnosis not present

## 2022-10-30 DIAGNOSIS — G8929 Other chronic pain: Secondary | ICD-10-CM | POA: Diagnosis not present

## 2022-10-30 DIAGNOSIS — G43909 Migraine, unspecified, not intractable, without status migrainosus: Secondary | ICD-10-CM | POA: Diagnosis not present

## 2022-10-30 DIAGNOSIS — Z0000 Encounter for general adult medical examination without abnormal findings: Secondary | ICD-10-CM | POA: Diagnosis not present

## 2022-10-30 DIAGNOSIS — E119 Type 2 diabetes mellitus without complications: Secondary | ICD-10-CM | POA: Diagnosis not present

## 2022-10-30 DIAGNOSIS — M797 Fibromyalgia: Secondary | ICD-10-CM | POA: Diagnosis not present

## 2022-10-30 DIAGNOSIS — M2550 Pain in unspecified joint: Secondary | ICD-10-CM | POA: Diagnosis not present

## 2022-11-01 DIAGNOSIS — B372 Candidiasis of skin and nail: Secondary | ICD-10-CM | POA: Diagnosis not present

## 2022-11-01 DIAGNOSIS — E119 Type 2 diabetes mellitus without complications: Secondary | ICD-10-CM | POA: Diagnosis not present

## 2022-11-01 DIAGNOSIS — R635 Abnormal weight gain: Secondary | ICD-10-CM | POA: Diagnosis not present

## 2022-11-01 DIAGNOSIS — Z6832 Body mass index (BMI) 32.0-32.9, adult: Secondary | ICD-10-CM | POA: Diagnosis not present

## 2022-11-13 ENCOUNTER — Telehealth (HOSPITAL_COMMUNITY): Payer: Self-pay | Admitting: *Deleted

## 2022-11-13 MED ORDER — ALPRAZOLAM 0.5 MG PO TABS: 0.5000 mg | ORAL_TABLET | Freq: Every evening | ORAL | 0 refills | Status: DC | PRN

## 2022-11-13 NOTE — Addendum Note (Signed)
Addended by: Thresa Ross on: 11/13/2022 11:06 AM   Modules accepted: Orders

## 2022-11-13 NOTE — Telephone Encounter (Signed)
PATIENT REQUEST--  [facility] - [place], [place] - [address]   ALPRAZolam (XANAX) 0.5 MG tablet [[id number]]   Order Details Dose: 0.5 mg Route: Oral Frequency: At bedtime PRN  Dispense Quantity: 30 tablet Refills: 0        Sig: TAKE 1 TABLET BY MOUTH AT BEDTIME AS NEEDED   LAST APPT-09/07/22 NEXT APPT-12/05/22

## 2022-11-23 DIAGNOSIS — M069 Rheumatoid arthritis, unspecified: Secondary | ICD-10-CM | POA: Diagnosis not present

## 2022-11-23 DIAGNOSIS — M2550 Pain in unspecified joint: Secondary | ICD-10-CM | POA: Diagnosis not present

## 2022-11-23 DIAGNOSIS — G8929 Other chronic pain: Secondary | ICD-10-CM | POA: Diagnosis not present

## 2022-11-23 DIAGNOSIS — E119 Type 2 diabetes mellitus without complications: Secondary | ICD-10-CM | POA: Diagnosis not present

## 2022-11-23 DIAGNOSIS — E039 Hypothyroidism, unspecified: Secondary | ICD-10-CM | POA: Diagnosis not present

## 2022-11-23 DIAGNOSIS — M797 Fibromyalgia: Secondary | ICD-10-CM | POA: Diagnosis not present

## 2022-11-23 DIAGNOSIS — G43909 Migraine, unspecified, not intractable, without status migrainosus: Secondary | ICD-10-CM | POA: Diagnosis not present

## 2022-11-23 DIAGNOSIS — E782 Mixed hyperlipidemia: Secondary | ICD-10-CM | POA: Diagnosis not present

## 2022-11-23 DIAGNOSIS — M5442 Lumbago with sciatica, left side: Secondary | ICD-10-CM | POA: Diagnosis not present

## 2022-12-05 ENCOUNTER — Telehealth (INDEPENDENT_AMBULATORY_CARE_PROVIDER_SITE_OTHER): Payer: Medicare HMO | Admitting: Psychiatry

## 2022-12-05 ENCOUNTER — Encounter (HOSPITAL_COMMUNITY): Payer: Self-pay | Admitting: Psychiatry

## 2022-12-05 DIAGNOSIS — F321 Major depressive disorder, single episode, moderate: Secondary | ICD-10-CM

## 2022-12-05 DIAGNOSIS — M797 Fibromyalgia: Secondary | ICD-10-CM

## 2022-12-05 DIAGNOSIS — F411 Generalized anxiety disorder: Secondary | ICD-10-CM

## 2022-12-05 DIAGNOSIS — F4310 Post-traumatic stress disorder, unspecified: Secondary | ICD-10-CM

## 2022-12-05 MED ORDER — DULOXETINE HCL 60 MG PO CPEP: 60.0000 mg | ORAL_CAPSULE | Freq: Two times a day (BID) | ORAL | 1 refills | Status: DC

## 2022-12-05 MED ORDER — BUPROPION HCL ER (SR) 150 MG PO TB12: ORAL_TABLET | ORAL | 1 refills | Status: DC

## 2022-12-05 MED ORDER — BREXPIPRAZOLE 1 MG PO TABS: 1.5000 mg | ORAL_TABLET | Freq: Every day | ORAL | 1 refills | Status: DC

## 2022-12-05 MED ORDER — LAMOTRIGINE 200 MG PO TABS: 200.0000 mg | ORAL_TABLET | Freq: Two times a day (BID) | ORAL | 0 refills | Status: DC

## 2022-12-05 MED ORDER — ALPRAZOLAM 0.5 MG PO TABS: 0.5000 mg | ORAL_TABLET | Freq: Every evening | ORAL | 0 refills | Status: DC | PRN

## 2022-12-05 NOTE — Progress Notes (Signed)
[facility] Follow up visit  Patient Identification: Jessica Vaughn MRN:  [id number] Date of Evaluation:  12/05/2022 Referral Source: [facility] Chief Complaint:  follow up depression, irregular sleep Visit Diagnosis:    ICD-10-CM   1. Current moderate episode of major depressive disorder without prior episode (HCC)  F32.1 lamoTRIgine (LAMICTAL) 200 MG tablet    2. GAD (generalized anxiety disorder)  F41.1     3. PTSD (post-traumatic stress disorder)  F43.10     4. Fibromyalgia  M79.7     Virtual Visit via Video Note  I connected with Albina Schlottman on 12/05/22 at  1:30 PM EDT by a video enabled telemedicine application and verified that I am speaking with the correct person using two identifiers.  Location: Patient: home Provider: home office   I discussed the limitations of evaluation and management by telemedicine and the availability of in person appointments. The patient expressed understanding and agreed to proceed.         I discussed the assessment and treatment plan with the patient. The patient was provided an opportunity to ask questions and all were answered. The patient agreed with the plan and demonstrated an understanding of the instructions.   The patient was advised to call back or seek an in-person evaluation if the symptoms worsen or if the condition fails to improve as anticipated.  I provided 20 minutes of non-face-to-face time during this encounter.    History of Present Illness: Patient is a 57 years old single Caucasian female initially  referred by pain clinic for management and to establish care for depression and possible bipolar.  she suffers from chronic pain rheumatoid arthritis   On eval today doing fair mood wise, has good and bad days . Overal meds keep some balance, pain can effect sleep and mood. Poor apetite due to pain at times   Son has come back from [place] due to some congenital condition. 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   Aggravating factors; co morbid medical conditions, fibromyalgia Modifying factors; boys  Duration since young age Severity  fluctuates Past Psychiatric History: depression, anxiety   Previous Psychotropic Medications: Yes     Past Medical History:  Past Medical History:  Diagnosis Date   Anxiety    Arthritis    COPD (chronic obstructive pulmonary disease) (HCC)    Depression    Neuromuscular disorder (HCC)    Osteoporosis    Seizures (HCC)    Sleep apnea    Ulcer     Past Surgical History:  Procedure Laterality Date   ROTATOR CUFF REPAIR Left 2009    Family Psychiatric History: mom : depression  Family History:  Family History  Problem Relation Age of Onset   Depression Mother    Cancer Father    Diabetes Father    Hypertension Father    Drug abuse Sister    Colon cancer Neg Hx     Social History:   Social History   Socioeconomic History   Marital status: Divorced    Spouse name: Not on file   Number of children: 2   Years of education: Not on file   Highest education level: Not on file  Occupational History   Not on file  Tobacco Use   Smoking status: Every Day    Current packs/day: 0.50    Types: Cigarettes   Smokeless tobacco: Never  Vaping Use   Vaping status: Never Used  Substance and Sexual Activity   Alcohol use: No  Drug use: No   Sexual activity: Yes    Birth control/protection: Post-menopausal  Other Topics Concern   Not on file  Social History Narrative   Lives with 2 sons, 1 is disabled. Moved her recently for [place].    Social Determinants of Health   Financial Resource Strain: Patient Declined (12/17/2021)   Received from [facility], [facility]   Overall Financial Resource Strain (CARDIA)    Difficulty of Paying Living Expenses: Patient declined  Food Insecurity: No Food Insecurity (12/17/2021)   Received from [facility], [facility]   Hunger Vital Sign    Worried About Running Out of Food in  the Last Year: Never true    Ran Out of Food in the Last Year: Never true  Transportation Needs: Not on file  Physical Activity: Unknown (12/17/2021)   Received from [facility], [facility]   Exercise Vital Sign    Days of Exercise per Week: Patient declined    Minutes of Exercise per Session: 10 min  Stress: Stress Concern Present (12/17/2021)   Received from [facility], [facility] of Occupational Health - Occupational Stress Questionnaire    Feeling of Stress : Very much  Social Connections: Socially Isolated (12/17/2021)   Received from [facility], [facility]   Social Network    How would you rate your social network (family, work, friends)?: Little participation, lonely and socially isolated      Allergies:   Allergies  Allergen Reactions   Meloxicam Other (See Comments)    Interacts with other meds   Tramadol Other (See Comments)    Interacts with other meds   Robaxin [Methocarbamol]    Zanaflex [Tizanidine Hcl]     Metabolic Disorder Labs: Lab Results  Component Value Date   HGBA1C 5.5 11/06/2016   No results found for: "PROLACTIN" Lab Results  Component Value Date   CHOL 238 (H) 11/06/2016   TRIG 144 11/06/2016   HDL 52 11/06/2016   CHOLHDL 4.6 (H) 11/06/2016   LDLCALC 157 (H) 11/06/2016   Lab Results  Component Value Date   TSH 1.740 11/13/2016    Therapeutic Level Labs: No results found for: "LITHIUM" No results found for: "CBMZ" No results found for: "VALPROATE"  Current Medications: Current Outpatient Medications  Medication Sig Dispense Refill   albuterol (PROVENTIL HFA;VENTOLIN HFA) 108 (90 Base) MCG/ACT inhaler Inhale 1 puff into the lungs every 6 (six) hours as needed for wheezing or shortness of breath. 1 Inhaler 6   ALPRAZolam (XANAX) 0.5 MG tablet Take 1 tablet (0.5 mg total) by mouth at bedtime as needed. 30 tablet 0   AMITIZA 24 MCG capsule TAKE 1 CAPSULE BY MOUTH TWICE DAILY WITH MEALS 60 capsule 5    atorvastatin (LIPITOR) 40 MG tablet Take 1 tablet (40 mg total) by mouth daily. 90 tablet 3   baclofen (LIORESAL) 10 MG tablet Take 10 mg by mouth 3 (three) times daily.     brexpiprazole (REXULTI) 1 MG TABS tablet Take 1.5 tablets (1.5 mg total) by mouth at bedtime. 45 tablet 1   buPROPion (WELLBUTRIN SR) 150 MG 12 hr tablet SMARTSIG:1 Tablet(s) By Mouth Every 12 Hours 30 tablet 1   cholecalciferol (VITAMIN D) 1000 units tablet Take 1 tablet (1,000 Units total) by mouth daily. 30 tablet 5   DULoxetine (CYMBALTA) 60 MG capsule Take 1 capsule (60 mg total) by mouth 2 (two) times daily. 60 capsule 1   eletriptan (RELPAX) 40 MG tablet TAKE ONE TABLET  BY MOUTH AT EARLIEST ONSET OF MIGRAINE, MAY REPEAT ONCE IN 2 HOURS IF HEADACHE PERSISTS OR RECURS 9 tablet 3   Fluticasone-Salmeterol (ADVAIR) 250-50 MCG/DOSE AEPB Inhale 1 puff into the lungs 2 (two) times daily. 60 each 5   folic acid (FOLVITE) 1 MG tablet Take 1 tablet (1 mg total) by mouth daily. 30 tablet 5   furosemide (LASIX) 40 MG tablet Take 1 tablet (40 mg total) by mouth daily. 30 tablet 5   HYDROcodone-acetaminophen (NORCO/VICODIN) 5-325 MG tablet Take by mouth.     hydrocortisone (ANUSOL-HC) 2.5 % rectal cream Place 1 application rectally 2 (two) times daily. 30 g 1   ibuprofen (ADVIL,MOTRIN) 800 MG tablet TAKE 1 TABLET BY MOUTH EVERY 8 HOURS AS NEEDED 90 tablet 2   ibuprofen (ADVIL,MOTRIN) 800 MG tablet Take 1 tablet (800 mg total) by mouth every 8 (eight) hours as needed. 90 tablet 3   lamoTRIgine (LAMICTAL) 200 MG tablet Take 1 tablet (200 mg total) by mouth 2 (two) times daily. 180 tablet 0   metFORMIN (GLUCOPHAGE) 500 MG tablet Take 1 tablet (500 mg total) by mouth 2 (two) times daily with a meal. 60 tablet 1   methocarbamol (ROBAXIN) 500 MG tablet Take 1 tablet (500 mg total) by mouth every 8 (eight) hours as needed for muscle spasms. 30 tablet 1   methotrexate 2.5 MG tablet TAKE 8 TABLETS BY MOUTH ONCE A WEEK 32 tablet 5   naproxen  (NAPROSYN) 500 MG tablet Take one with every sumatriptan dose 60 tablet 1   pantoprazole (PROTONIX) 40 MG tablet TAKE 1 TABLET BY MOUTH ONCE DAILY 30 tablet 5   polyethylene glycol-electrolytes (TRILYTE) 420 g solution Take 4,000 mLs by mouth as directed. 4000 mL 0   promethazine (PHENERGAN) 25 MG tablet Take 1 tablet (25 mg total) by mouth every 6 (six) hours as needed for nausea or vomiting. 30 tablet 0   propranolol ER (INDERAL LA) 60 MG 24 hr capsule Take 1 capsule (60 mg total) by mouth daily. 30 capsule 2   SUMAtriptan (IMITREX) 100 MG tablet Take 1 tablet (100 mg total) by mouth once as needed for migraine. May repeat in 2 hours if headache persists or recurs. 10 tablet 2   vitamin B-12 (CYANOCOBALAMIN) 1000 MCG tablet Take 500 mcg by mouth daily.     Vitamin D, Ergocalciferol, (DRISDOL) 50000 units CAPS capsule TAKE 1 CAPSULE BY MOUTH TWICE A WEEK 16 capsule 0   No current facility-administered medications for this visit.      Psychiatric Specialty Exam: Review of Systems  Cardiovascular:  Negative for chest pain.  Musculoskeletal:  Positive for arthralgias and myalgias.  Skin:  Negative for rash.  Neurological:  Negative for tremors.  Psychiatric/Behavioral:  Negative for agitation, hallucinations and self-injury.     There were no vitals taken for this visit.There is no height or weight on file to calculate BMI.  General Appearance: Casual  Eye Contact:  Fair  Speech:  Clear and Coherent  Volume:  Normal  Mood: fair  Affect:  Congruent  Thought Process:  Goal Directed  Orientation:  Full (Time, Place, and Person)  Thought Content:  Rumination  Suicidal Thoughts:  No  Homicidal Thoughts:  No  Memory:  Immediate;   Fair Recent;   Fair  Judgement:  Fair  Insight:  Fair  Psychomotor Activity:  Decreased  Concentration:  Concentration: Fair and Attention Span: Fair  Recall:  [employer] of Knowledge:Good  Language: Good  Akathisia:  No  Handed:   AIMS (if indicated):   not done  Assets:  Desire for Improvement Social Support  ADL's:  Intact  Cognition: WNL  Sleep:  Fair to variable decreased   Screenings: Mini-Mental    Flowsheet Row Office Visit from 11/13/2016 in [facility]  Total Score (max 30 points ) 15      PHQ2-9    Flowsheet Row Counselor from 08/22/2020 in [facility] Video Visit from 06/30/2020 in [facility] at [facility] Office Visit from 11/13/2016 in [facility] Office Visit from 11/06/2016 in [facility] Office Visit from 10/04/2016 in [facility]  PHQ-2 Total Score 2 2 6 6  0  PHQ-9 Total Score 16 8 24 24  --      Flowsheet Row Video Visit from 01/26/2022 in [facility] at [facility] Video Visit from 10/27/2021 in [facility] at [facility] Video Visit from 07/13/2021 in [facility] at [facility]  C-SSRS RISK CATEGORY No Risk No Risk No Risk       Assessment and Plan: as follows  Prior documentation reviewed  Mood disorder possible bipolar disorder, depressed: manageable, continue lamictal, rexulti, wellbutrin  No headaches, chest pain  GAD: fair continue xanax at night and cymbalta for anxiety   PTSD: baseline, avoids triggers, continue cymbalta  Meds renewed which were due Fu 3 - 71m.   Thresa Ross, MD 8/14/20241:42 PM

## 2022-12-07 DIAGNOSIS — F17210 Nicotine dependence, cigarettes, uncomplicated: Secondary | ICD-10-CM | POA: Diagnosis not present

## 2022-12-07 DIAGNOSIS — Z87891 Personal history of nicotine dependence: Secondary | ICD-10-CM | POA: Diagnosis not present

## 2022-12-07 DIAGNOSIS — R0602 Shortness of breath: Secondary | ICD-10-CM | POA: Diagnosis not present

## 2022-12-12 DIAGNOSIS — D72828 Other elevated white blood cell count: Secondary | ICD-10-CM | POA: Diagnosis not present

## 2022-12-12 DIAGNOSIS — E6609 Other obesity due to excess calories: Secondary | ICD-10-CM | POA: Diagnosis not present

## 2022-12-12 DIAGNOSIS — Z720 Tobacco use: Secondary | ICD-10-CM | POA: Diagnosis not present

## 2022-12-12 DIAGNOSIS — Z6831 Body mass index (BMI) 31.0-31.9, adult: Secondary | ICD-10-CM | POA: Diagnosis not present

## 2022-12-12 DIAGNOSIS — E1165 Type 2 diabetes mellitus with hyperglycemia: Secondary | ICD-10-CM | POA: Diagnosis not present

## 2022-12-12 DIAGNOSIS — M0579 Rheumatoid arthritis with rheumatoid factor of multiple sites without organ or systems involvement: Secondary | ICD-10-CM | POA: Diagnosis not present

## 2022-12-14 DIAGNOSIS — Z6831 Body mass index (BMI) 31.0-31.9, adult: Secondary | ICD-10-CM | POA: Diagnosis not present

## 2022-12-14 DIAGNOSIS — E6609 Other obesity due to excess calories: Secondary | ICD-10-CM | POA: Diagnosis not present

## 2022-12-14 DIAGNOSIS — B372 Candidiasis of skin and nail: Secondary | ICD-10-CM | POA: Diagnosis not present

## 2022-12-14 DIAGNOSIS — E119 Type 2 diabetes mellitus without complications: Secondary | ICD-10-CM | POA: Diagnosis not present

## 2022-12-21 DIAGNOSIS — Z6831 Body mass index (BMI) 31.0-31.9, adult: Secondary | ICD-10-CM | POA: Diagnosis not present

## 2022-12-21 DIAGNOSIS — Z79899 Other long term (current) drug therapy: Secondary | ICD-10-CM | POA: Diagnosis not present

## 2022-12-21 DIAGNOSIS — E119 Type 2 diabetes mellitus without complications: Secondary | ICD-10-CM | POA: Diagnosis not present

## 2022-12-21 DIAGNOSIS — G43909 Migraine, unspecified, not intractable, without status migrainosus: Secondary | ICD-10-CM | POA: Diagnosis not present

## 2022-12-21 DIAGNOSIS — M2550 Pain in unspecified joint: Secondary | ICD-10-CM | POA: Diagnosis not present

## 2022-12-21 DIAGNOSIS — E039 Hypothyroidism, unspecified: Secondary | ICD-10-CM | POA: Diagnosis not present

## 2022-12-21 DIAGNOSIS — M069 Rheumatoid arthritis, unspecified: Secondary | ICD-10-CM | POA: Diagnosis not present

## 2022-12-21 DIAGNOSIS — E782 Mixed hyperlipidemia: Secondary | ICD-10-CM | POA: Diagnosis not present

## 2022-12-21 DIAGNOSIS — E559 Vitamin D deficiency, unspecified: Secondary | ICD-10-CM | POA: Diagnosis not present

## 2023-01-10 ENCOUNTER — Other Ambulatory Visit (HOSPITAL_COMMUNITY): Payer: Self-pay | Admitting: Psychiatry

## 2023-02-08 ENCOUNTER — Telehealth (HOSPITAL_COMMUNITY): Payer: Self-pay | Admitting: Psychiatry

## 2023-02-08 MED ORDER — ALPRAZOLAM 0.5 MG PO TABS: 0.5000 mg | ORAL_TABLET | Freq: Every evening | ORAL | 1 refills | Status: DC | PRN

## 2023-02-08 NOTE — Telephone Encounter (Signed)
Pt calling.  Needs refill on xanax .5mg  [facility] [place]  Last appt: 8/14 Next appt: 12/18

## 2023-04-10 ENCOUNTER — Telehealth (HOSPITAL_COMMUNITY): Payer: Medicare HMO | Admitting: Psychiatry

## 2023-04-10 ENCOUNTER — Encounter (HOSPITAL_COMMUNITY): Payer: Self-pay | Admitting: Psychiatry

## 2023-04-10 DIAGNOSIS — F411 Generalized anxiety disorder: Secondary | ICD-10-CM | POA: Diagnosis not present

## 2023-04-10 DIAGNOSIS — F321 Major depressive disorder, single episode, moderate: Secondary | ICD-10-CM

## 2023-04-10 DIAGNOSIS — F4310 Post-traumatic stress disorder, unspecified: Secondary | ICD-10-CM | POA: Diagnosis not present

## 2023-04-10 DIAGNOSIS — M797 Fibromyalgia: Secondary | ICD-10-CM

## 2023-04-10 MED ORDER — ALPRAZOLAM 0.5 MG PO TABS: 0.5000 mg | ORAL_TABLET | Freq: Every evening | ORAL | 1 refills | Status: DC | PRN

## 2023-04-10 NOTE — Progress Notes (Signed)
[facility] Follow up visit  Patient Identification: Jessica Vaughn MRN:  [id number] Date of Evaluation:  04/10/2023 Referral Source: [facility] Chief Complaint:  follow up depression, irregular sleep Visit Diagnosis:    ICD-10-CM   1. Current moderate episode of major depressive disorder without prior episode (HCC)  F32.1     2. GAD (generalized anxiety disorder)  F41.1     3. PTSD (post-traumatic stress disorder)  F43.10     4. Fibromyalgia  M79.7      Virtual Visit via Video Note  I connected with Jessica Vaughn on 04/10/23 at  1:30 PM EST by a video enabled telemedicine application and verified that I am speaking with the correct person using two identifiers.  Location: Patient: home Provider: home office   I discussed the limitations of evaluation and management by telemedicine and the availability of in person appointments. The patient expressed understanding and agreed to proceed.     I discussed the assessment and treatment plan with the patient. The patient was provided an opportunity to ask questions and all were answered. The patient agreed with the plan and demonstrated an understanding of the instructions.   The patient was advised to call back or seek an in-person evaluation if the symptoms worsen or if the condition fails to improve as anticipated.  I provided 20 minutes of non-face-to-face time during this encounter.       History of Present Illness: Patient is a 57 years old single Caucasian female initially  referred by pain clinic for management and to establish care for depression and possible bipolar.  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     Aggravating factors; co morbid medical conditions, fibromyalgia Modifying factors; boys  Duration since young age Severity  fluctuates due to see above  Past Psychiatric History: depression, anxiety   Previous Psychotropic Medications: Yes     Past Medical History:  Past Medical History:  Diagnosis Date   Anxiety    Arthritis    COPD (chronic obstructive pulmonary disease) (HCC)    Depression    Neuromuscular disorder (HCC)    Osteoporosis    Seizures (HCC)    Sleep apnea    Ulcer     Past Surgical History:  Procedure Laterality Date   ROTATOR CUFF REPAIR Left 2009    Family Psychiatric History: mom : depression  Family History:  Family History  Problem Relation Age of Onset   Depression Mother    Cancer Father    Diabetes Father    Hypertension Father    Drug abuse Sister    Colon cancer Neg Hx     Social History:   Social History   Socioeconomic History   Marital status: Divorced    Spouse name: Not on file   Number of children: 2   Years of education: Not on file   Highest education level: Not on file  Occupational History   Not on file  Tobacco Use   Smoking status: Every Day    Current packs/day: 0.50    Types: Cigarettes   Smokeless tobacco: Never  Vaping Use   Vaping status: Never Used  Substance and Sexual Activity   Alcohol use: No   Drug  use: No   Sexual activity: Yes    Birth control/protection: Post-menopausal  Other Topics Concern   Not on file  Social History Narrative   Lives with 2 sons, 1 is disabled. Moved her recently for [place].    Social Drivers of Corporate investment banker Strain: Low Risk  (12/12/2022)   [facility]   Overall Financial Resource Strain (CARDIA)    Difficulty of Paying Living Expenses: Not hard at all  Food Insecurity: No Food Insecurity (12/12/2022)   Received from [facility]   Hunger Vital Sign    Worried About Running Out of Food in the Last Year: Never true    Ran  Out of Food in the Last Year: Never true  Transportation Needs: No Transportation Needs (12/12/2022)   Received from [facility] - Transportation    Lack of Transportation (Medical): No    Lack of Transportation (Non-Medical): No  Physical Activity: Unknown (12/17/2021)   [facility]   Exercise Vital Sign    Days of Exercise per Week: Patient declined    Minutes of Exercise per Session: 10 min  Stress: Stress Concern Present (12/17/2021)   Received [facility] of Occupational Health - Occupational Stress Questionnaire    Feeling of Stress : Very much  Social Connections: Unknown (12/20/2022)   Received from [facility]   Social Network    Social Network: Not on file      Allergies:   Allergies  Allergen Reactions   Meloxicam Other (See Comments)    Interacts with other meds   Tramadol Other (See Comments)    Interacts with other meds   Robaxin [Methocarbamol]    Zanaflex [Tizanidine Hcl]     Metabolic Disorder Labs: Lab Results  Component Value Date   HGBA1C 5.5 11/06/2016   No results found for: "PROLACTIN" Lab Results  Component Value Date   CHOL 238 (H) 11/06/2016   TRIG 144 11/06/2016   HDL 52 11/06/2016   CHOLHDL 4.6 (H) 11/06/2016   LDLCALC 157 (H) 11/06/2016   Lab Results  Component Value Date   TSH 1.740 11/13/2016    Therapeutic Level Labs: No results found for: "LITHIUM" No results found for: "CBMZ" No results found for: "VALPROATE"  Current Medications: Current Outpatient Medications  Medication Sig Dispense Refill   albuterol (PROVENTIL HFA;VENTOLIN HFA) 108 (90 Base) MCG/ACT inhaler Inhale 1 puff into the lungs every 6 (six) hours as needed for wheezing or shortness of breath. 1 Inhaler 6   ALPRAZolam (XANAX) 0.5 MG tablet Take 1 tablet (0.5 mg total) by mouth at bedtime as needed. 30 tablet 1   AMITIZA 24 MCG capsule TAKE 1 CAPSULE BY MOUTH TWICE DAILY WITH  MEALS 60 capsule 5   atorvastatin (LIPITOR) 40 MG tablet Take 1 tablet (40 mg total) by mouth daily. 90 tablet 3   baclofen (LIORESAL) 10 MG tablet Take 10 mg by mouth 3 (three) times daily.     brexpiprazole (REXULTI) 1 MG TABS tablet Take 1.5 tablets (1.5 mg total) by mouth at bedtime. 45 tablet 1   buPROPion (WELLBUTRIN SR) 150 MG 12 hr tablet SMARTSIG:1 Tablet(s) By Mouth Every 12 Hours 30 tablet 1   cholecalciferol (VITAMIN D) 1000 units tablet Take 1 tablet (1,000 Units total) by mouth daily. 30 tablet 5   DULoxetine (CYMBALTA) 60 MG capsule Take 1 capsule (60 mg total) by mouth 2 (two) times daily. 60  capsule 1   eletriptan (RELPAX) 40 MG tablet TAKE ONE TABLET BY MOUTH AT EARLIEST ONSET OF MIGRAINE, MAY REPEAT ONCE IN 2 HOURS IF HEADACHE PERSISTS OR RECURS 9 tablet 3   Fluticasone-Salmeterol (ADVAIR) 250-50 MCG/DOSE AEPB Inhale 1 puff into the lungs 2 (two) times daily. 60 each 5   folic acid (FOLVITE) 1 MG tablet Take 1 tablet (1 mg total) by mouth daily. 30 tablet 5   furosemide (LASIX) 40 MG tablet Take 1 tablet (40 mg total) by mouth daily. 30 tablet 5   HYDROcodone-acetaminophen (NORCO/VICODIN) 5-325 MG tablet Take by mouth.     hydrocortisone (ANUSOL-HC) 2.5 % rectal cream Place 1 application rectally 2 (two) times daily. 30 g 1   ibuprofen (ADVIL,MOTRIN) 800 MG tablet TAKE 1 TABLET BY MOUTH EVERY 8 HOURS AS NEEDED 90 tablet 2   ibuprofen (ADVIL,MOTRIN) 800 MG tablet Take 1 tablet (800 mg total) by mouth every 8 (eight) hours as needed. 90 tablet 3   lamoTRIgine (LAMICTAL) 200 MG tablet Take 1 tablet (200 mg total) by mouth 2 (two) times daily. 180 tablet 0   metFORMIN (GLUCOPHAGE) 500 MG tablet Take 1 tablet (500 mg total) by mouth 2 (two) times daily with a meal. 60 tablet 1   methocarbamol (ROBAXIN) 500 MG tablet Take 1 tablet (500 mg total) by mouth every 8 (eight) hours as needed for muscle spasms. 30 tablet 1   methotrexate 2.5 MG tablet TAKE 8 TABLETS BY MOUTH ONCE A WEEK 32  tablet 5   naproxen (NAPROSYN) 500 MG tablet Take one with every sumatriptan dose 60 tablet 1   pantoprazole (PROTONIX) 40 MG tablet TAKE 1 TABLET BY MOUTH ONCE DAILY 30 tablet 5   polyethylene glycol-electrolytes (TRILYTE) 420 g solution Take 4,000 mLs by mouth as directed. 4000 mL 0   promethazine (PHENERGAN) 25 MG tablet Take 1 tablet (25 mg total) by mouth every 6 (six) hours as needed for nausea or vomiting. 30 tablet 0   propranolol ER (INDERAL LA) 60 MG 24 hr capsule Take 1 capsule (60 mg total) by mouth daily. 30 capsule 2   SUMAtriptan (IMITREX) 100 MG tablet Take 1 tablet (100 mg total) by mouth once as needed for migraine. May repeat in 2 hours if headache persists or recurs. 10 tablet 2   vitamin B-12 (CYANOCOBALAMIN) 1000 MCG tablet Take 500 mcg by mouth daily.     Vitamin D, Ergocalciferol, (DRISDOL) 50000 units CAPS capsule TAKE 1 CAPSULE BY MOUTH TWICE A WEEK 16 capsule 0   No current facility-administered medications for this visit.      Psychiatric Specialty Exam: Review of Systems  Cardiovascular:  Negative for chest pain.  Musculoskeletal:  Positive for arthralgias and myalgias.  Skin:  Negative for rash.  Neurological:  Negative for tremors.  Psychiatric/Behavioral:  Negative for agitation, hallucinations and self-injury.     There were no vitals taken for this visit.There is no height or weight on file to calculate BMI.  General Appearance: Casual  Eye Contact:  Fair  Speech:  Clear and Coherent  Volume:  Normal  Mood: fair  Affect:  Congruent  Thought Process:  Goal Directed  Orientation:  Full (Time, Place, and Person)  Thought Content:  Rumination  Suicidal Thoughts:  No  Homicidal Thoughts:  No  Memory:  Immediate;   Fair Recent;   Fair  Judgement:  Fair  Insight:  Fair  Psychomotor Activity:  Decreased  Concentration:  Concentration: Fair and Attention Span: Fair  Recall:  Fair  [employer] of Knowledge:Good  Language: Good  Akathisia:  No  Handed:    AIMS (if indicated):  not done  Assets:  Desire for Improvement Social Support  ADL's:  Intact  Cognition: WNL  Sleep:  Fair to variable decreased   Screenings: Mini-Mental    Flowsheet Row Office Visit from 11/13/2016 in [facility]  Total Score (max 30 points ) 15      PHQ2-9    Flowsheet Row Counselor from 08/22/2020 in [facility] at [facility] Video Visit from 06/30/2020 in [facility] at [facility] Office Visit from 11/13/2016 in [facility] Office Visit from 11/06/2016 in [facility] Office Visit from 10/04/2016 in [facility]  PHQ-2 Total Score 2 2 6 6  0  PHQ-9 Total Score 16 8 24 24  --      Flowsheet Row Video Visit from 01/26/2022 in [facility] at [facility] Video Visit from 10/27/2021 in [facility] at [facility] Video Visit from 07/13/2021 in [facility] at [facility]  C-SSRS RISK CATEGORY No Risk No Risk No Risk       Assessment and Plan: as follows  Prior documentation reviewed  Mood disorder possible bipolar disorder, depressed: manageable , I still reocmmend therapy to deal with co morbid medical conditions Continue wellbutrin and meds  No headaches, chest pain  GAD: fluctuates, continue cymbalta, also on xanax daily helps with coping PTSD: baseline, continue cymbalta, recommend therapy to work on triggers and fibromyalgia  Fu 3 m. Renewed med which were due schedule therapy Thresa Ross, MD 12/18/20241:33 PM

## 2023-04-23 ENCOUNTER — Telehealth (HOSPITAL_COMMUNITY): Payer: Self-pay | Admitting: *Deleted

## 2023-04-23 NOTE — Telephone Encounter (Signed)
P.A. APPROVED  brexpiprazole (REXULTI) 1 MG TABS tablet   AUTHORIZED:  01/22/23  --- 04/22/24

## 2023-05-01 ENCOUNTER — Ambulatory Visit (HOSPITAL_COMMUNITY): Payer: Medicare HMO | Admitting: Licensed Clinical Social Worker

## 2023-06-07 ENCOUNTER — Other Ambulatory Visit (HOSPITAL_COMMUNITY): Payer: Self-pay | Admitting: Psychiatry

## 2023-07-30 ENCOUNTER — Telehealth (HOSPITAL_COMMUNITY): Payer: Self-pay | Admitting: *Deleted

## 2023-07-30 MED ORDER — ALPRAZOLAM 0.5 MG PO TABS: 0.5000 mg | ORAL_TABLET | Freq: Every evening | ORAL | 1 refills | Status: DC | PRN

## 2023-07-30 NOTE — Telephone Encounter (Signed)
 PATIENT Rx REQUEST  ALPRAZolam Prudy Feeler) 0.5 MG tablet  [facility] [address] [place] [address]   NEXT APPT-  08/21/23 LAST APPT- 12/05/22

## 2023-07-30 NOTE — Addendum Note (Signed)
 Addended by: Thresa Ross on: 07/30/2023 04:13 PM   Modules accepted: Orders

## 2023-08-07 ENCOUNTER — Telehealth (HOSPITAL_COMMUNITY): Payer: Medicare HMO | Admitting: Psychiatry

## 2023-08-21 ENCOUNTER — Telehealth (HOSPITAL_COMMUNITY): Admitting: Psychiatry

## 2023-08-21 ENCOUNTER — Encounter (HOSPITAL_COMMUNITY): Payer: Self-pay | Admitting: Psychiatry

## 2023-08-21 DIAGNOSIS — F4310 Post-traumatic stress disorder, unspecified: Secondary | ICD-10-CM

## 2023-08-21 DIAGNOSIS — M797 Fibromyalgia: Secondary | ICD-10-CM

## 2023-08-21 DIAGNOSIS — F411 Generalized anxiety disorder: Secondary | ICD-10-CM | POA: Diagnosis not present

## 2023-08-21 DIAGNOSIS — F321 Major depressive disorder, single episode, moderate: Secondary | ICD-10-CM

## 2023-08-21 MED ORDER — DULOXETINE HCL 60 MG PO CPEP: 60.0000 mg | ORAL_CAPSULE | Freq: Two times a day (BID) | ORAL | 1 refills | Status: DC

## 2023-08-21 MED ORDER — LAMOTRIGINE 200 MG PO TABS
200.0000 mg | ORAL_TABLET | Freq: Two times a day (BID) | ORAL | 0 refills | Status: DC
Start: 2023-08-21 — End: 2023-12-24

## 2023-08-21 MED ORDER — BUPROPION HCL ER (SR) 150 MG PO TB12: ORAL_TABLET | ORAL | 1 refills | Status: DC
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
## 2023-10-11 ENCOUNTER — Other Ambulatory Visit (HOSPITAL_COMMUNITY): Payer: Self-pay | Admitting: Psychiatry

## 2023-11-07 ENCOUNTER — Telehealth (HOSPITAL_COMMUNITY): Payer: Self-pay | Admitting: *Deleted

## 2023-11-07 ENCOUNTER — Other Ambulatory Visit (HOSPITAL_COMMUNITY): Payer: Self-pay | Admitting: Psychiatry

## 2023-11-07 MED ORDER — ALPRAZOLAM 0.5 MG PO TABS
0.2500 mg | ORAL_TABLET | Freq: Every evening | ORAL | 0 refills | Status: DC | PRN
Start: 2023-11-07 — End: 2023-12-24

## 2023-11-07 MED ORDER — BUSPIRONE HCL 7.5 MG PO TABS: 7.5000 mg | ORAL_TABLET | Freq: Every day | ORAL | 0 refills | Status: DC

## 2023-11-07 NOTE — Telephone Encounter (Signed)
 PATIENT IS VERY CONCERNED WHAT THE CHANGE TO THE XANAX  WILL BE SHE STATES SHE'S BEEN ON THAT FOR YEARS

## 2023-11-07 NOTE — Telephone Encounter (Signed)
 [employer] -- Our records show the patient is taking hydrocodone 7.5 mg-acetaminophen  325 mg tablet that interacts (drug-interaction) with the requested drug.

## 2023-11-07 NOTE — Telephone Encounter (Signed)
 INFORMED PATIENT OF NEW PLAN --- There is risk of extreme sedation and slow breathing. We can change to buspar  7.5mg  it would help as well, I can send xanax  0.5mg  she can take half tablet for next 15 days so no withdrawals then stop   AND SHE SAID OK THAT'S FINE

## 2023-11-07 NOTE — Addendum Note (Signed)
 Addended by: GERALENE KAISER on: 11/07/2023 12:33 PM   Modules accepted: Orders

## 2023-11-14 ENCOUNTER — Telehealth (HOSPITAL_COMMUNITY): Payer: Self-pay | Admitting: Psychiatry

## 2023-11-14 ENCOUNTER — Telehealth (HOSPITAL_COMMUNITY): Payer: Self-pay | Admitting: *Deleted

## 2023-11-14 NOTE — Telephone Encounter (Signed)
 Patient called stating her pharmacy informed her they needed a prior authorization (PA) to process recent order (11/07/2023) for  ALPRAZolam  (XANAX ) 0.5 MG tablet. Unable to locate fax request for PA or on patient's chart. Contacted patient's pharmacy to clarify and spoke with pharmacy tech Lanney) who indicated that patient's insurance company is denying coverage due to patient also being prescribed  HYDROcodone-acetaminophen  (NORCO/VICODIN) 5-325 MG tablet citing concerns with over-sedation. Pharmacy tech states patient did not want to pay for the alprazolam  prescription out of pocket resulting in her apparent confusion about the need for a PA. Pharmacy tech also stated they received an electronic order from provider yesterday for a  30 tablet supply of alprazolam  however this does not appear in the patient's record  - this is in addition to an order for a 30 tablet supply dated 11/07/2023 that is still active in pharmacy system. Pharmacy has placed alprazolam  orders on hold pending clarification from provider.  [facility] [address], [place] - [place] [address] Phone: [phone]  Fax: [phone]

## 2023-11-14 NOTE — Telephone Encounter (Signed)
 Spoke with Rx & informed that I was the 3 third person to call today.  Patient insurance isn't requiring a  P.A. patient is upset that she will have to pay the last $4.64 for medication  & that she is very upset because previously her insurance always paid  & she's not going to pay. Rx said we can try to do a P.A. to see if they will pay the $ 4.64

## 2023-12-20 ENCOUNTER — Encounter (HOSPITAL_COMMUNITY): Payer: Self-pay

## 2023-12-20 ENCOUNTER — Telehealth (HOSPITAL_COMMUNITY): Admitting: Psychiatry

## 2023-12-24 ENCOUNTER — Telehealth (INDEPENDENT_AMBULATORY_CARE_PROVIDER_SITE_OTHER): Admitting: Psychiatry

## 2023-12-24 ENCOUNTER — Encounter (HOSPITAL_COMMUNITY): Payer: Self-pay | Admitting: Psychiatry

## 2023-12-24 DIAGNOSIS — F4310 Post-traumatic stress disorder, unspecified: Secondary | ICD-10-CM

## 2023-12-24 DIAGNOSIS — F411 Generalized anxiety disorder: Secondary | ICD-10-CM

## 2023-12-24 DIAGNOSIS — M797 Fibromyalgia: Secondary | ICD-10-CM | POA: Diagnosis not present

## 2023-12-24 DIAGNOSIS — F321 Major depressive disorder, single episode, moderate: Secondary | ICD-10-CM

## 2023-12-24 MED ORDER — BUSPIRONE HCL 7.5 MG PO TABS: 7.5000 mg | ORAL_TABLET | Freq: Every day | ORAL | 0 refills | Status: DC

## 2023-12-24 MED ORDER — DULOXETINE HCL 60 MG PO CPEP: 60.0000 mg | ORAL_CAPSULE | Freq: Two times a day (BID) | ORAL | 1 refills | Status: DC

## 2023-12-24 MED ORDER — BUPROPION HCL ER (SR) 150 MG PO TB12: ORAL_TABLET | ORAL | 1 refills | Status: DC

## 2023-12-24 MED ORDER — LAMOTRIGINE 200 MG PO TABS: 200.0000 mg | ORAL_TABLET | Freq: Two times a day (BID) | ORAL | 0 refills | Status: DC

## 2023-12-24 NOTE — Progress Notes (Signed)
 [facility] Follow up visit  Patient Identification: Jessica Vaughn MRN:  [id number] Date of Evaluation:  12/24/2023 Referral Source: [facility] Chief Complaint:  follow up depression, irregular sleep Visit Diagnosis:    ICD-10-CM   1. Current moderate episode of major depressive disorder without prior episode (HCC)  F32.1 lamoTRIgine  (LAMICTAL ) 200 MG tablet    2. GAD (generalized anxiety disorder)  F41.1     3. PTSD (post-traumatic stress disorder)  F43.10     4. Fibromyalgia  M79.7     Virtual Visit via Video Note  I connected with Jessica Vaughn on 12/24/23 at  1:00 PM EDT by a video enabled telemedicine application and verified that I am speaking with the correct person using two identifiers.  Location: Patient: home Provider: home office   I discussed the limitations of evaluation and management by telemedicine and the availability of in person appointments. The patient expressed understanding and agreed to proceed.      I discussed the assessment and treatment plan with the patient. The patient was provided an opportunity to ask questions and all were answered. The patient agreed with the plan and demonstrated an understanding of the instructions.   The patient was advised to call back or seek an in-person evaluation if the symptoms worsen or if the condition fails to improve as anticipated.  I provided 20 minutes of non-face-to-face time during this encounter.   History of Present Illness: Patient is a 58 years old single Caucasian female initially  referred by pain clinic for management and to establish care for depression and possible bipolar.  she suffers from chronic pain rheumatoid arthritis  She has  multiple pain and co morbid conditions, including back condition and arthritis that effects her mobility, also had knee surgery  On evaluation today doing fair mood wise she has lost some weight on the diabetic medication.  She is suffering from back pain and  numbness of the foot so she is planning to go through spine MRI and following up with providers  Since she is on pain medication they have taken Xanax  off she is tolerating BuSpar  for anxiety but of course she feels that it is not as effective as Xanax  is but she is also on Cymbalta  and handling anxiety reasonable despite her medical comorbidities and stressors   There is no rash on Lamictal   Aggravating factors; co morbid medical conditions, fibromyalgia Modifying factors; boys  Duration since young age Severity  manageable  Past Psychiatric History: depression, anxiety   Previous Psychotropic Medications: Yes     Past Medical History:  Past Medical History:  Diagnosis Date   Anxiety    Arthritis    COPD (chronic obstructive pulmonary disease) (HCC)    Depression    Neuromuscular disorder (HCC)    Osteoporosis    Seizures (HCC)    Sleep apnea    Ulcer     Past Surgical History:  Procedure Laterality Date   ROTATOR CUFF REPAIR Left 2009    Family Psychiatric History: mom : depression  Family History:  Family History  Problem Relation Age of Onset   Depression Mother    Cancer Father    Diabetes Father    Hypertension Father    Drug abuse Sister    Colon cancer Neg Hx     Social History:   Social History   Socioeconomic History   Marital status: Divorced    Spouse name: Not on file   Number of children: 2   Years of  education: Not on file   Highest education level: Not on file  Occupational History   Not on file  Tobacco Use   Smoking status: Every Day    Current packs/day: 0.50    Types: Cigarettes   Smokeless tobacco: Never  Vaping Use   Vaping status: Never Used  Substance and Sexual Activity   Alcohol  use: No   Drug use: No   Sexual activity: Yes    Birth control/protection: Post-menopausal  Other Topics Concern   Not on file  Social History Narrative   Lives with 2 sons, 1 is disabled. Moved her recently for [place].    Social  Drivers of Health   Financial Resource Strain: Medium Risk (06/29/2023)   Received from [facility]   Overall Financial Resource Strain (CARDIA)    Difficulty of Paying Living Expenses: Somewhat hard  Food Insecurity: Food Insecurity Present (06/29/2023)   Received from [facility]   Hunger Vital Sign    Within the past 12 months, you worried that your food would run out before you got the money to buy more.: Sometimes true    Within the past 12 months, the food you bought just didn't last and you didn't have money to get more.: Sometimes true  Transportation Needs: Unmet Transportation Needs (06/29/2023)   Received from [facility] - Transportation    Lack of Transportation (Medical): Yes    Lack of Transportation (Non-Medical): Patient declined  Physical Activity: Unknown (06/29/2023)   Received from [facility]   Exercise Vital Sign    On average, how many days per week do you engage in moderate to strenuous exercise (like a brisk walk)?: 0 days    Minutes of Exercise per Session: Not on file  Stress: Stress Concern Present (06/29/2023)   Received from [facility] - Occupational Stress Questionnaire    Feeling of Stress : Very much  Social Connections: Socially Isolated (06/29/2023)   Received from [facility]   Social Network    How would you rate your social network (family, work, friends)?: Little participation, lonely and socially isolated      Allergies:   Allergies  Allergen Reactions   Meloxicam  Other (See Comments)    Interacts with other meds   Tramadol Other (See Comments)    Interacts with other meds   Robaxin  [Methocarbamol ]    Zanaflex [Tizanidine Hcl]     Metabolic Disorder Labs: Lab Results  Component Value Date   HGBA1C 5.5 11/06/2016   No results found for: PROLACTIN Lab Results  Component Value Date   CHOL 238 (H) 11/06/2016   TRIG 144 11/06/2016   HDL 52 11/06/2016   CHOLHDL 4.6 (H)  11/06/2016   LDLCALC 157 (H) 11/06/2016   Lab Results  Component Value Date   TSH 1.740 11/13/2016    Therapeutic Level Labs: No results found for: LITHIUM No results found for: CBMZ No results found for: VALPROATE  Current Medications: Current Outpatient Medications  Medication Sig Dispense Refill   albuterol  (PROVENTIL  HFA;VENTOLIN  HFA) 108 (90 Base) MCG/ACT inhaler Inhale 1 puff into the lungs every 6 (six) hours as needed for wheezing or shortness of breath. 1 Inhaler 6   AMITIZA  24 MCG capsule TAKE 1 CAPSULE BY MOUTH TWICE DAILY WITH MEALS 60 capsule 5   atorvastatin  (LIPITOR) 40 MG tablet Take 1 tablet (40 mg total) by mouth daily. 90 tablet 3   baclofen (LIORESAL) 10 MG tablet Take 10 mg  by mouth 3 (three) times daily.     brexpiprazole  (REXULTI ) 1 MG TABS tablet Take 1.5 tablets (1.5 mg total) by mouth at bedtime. 45 tablet 1   buPROPion  (WELLBUTRIN  SR) 150 MG 12 hr tablet SMARTSIG:1 Tablet(s) By Mouth Every 12 Hours 30 tablet 1   busPIRone  (BUSPAR ) 7.5 MG tablet Take 1 tablet (7.5 mg total) by mouth daily. 30 tablet 0   cholecalciferol (VITAMIN D ) 1000 units tablet Take 1 tablet (1,000 Units total) by mouth daily. 30 tablet 5   DULoxetine  (CYMBALTA ) 60 MG capsule Take 1 capsule (60 mg total) by mouth 2 (two) times daily. 60 capsule 1   eletriptan  (RELPAX ) 40 MG tablet TAKE ONE TABLET BY MOUTH AT EARLIEST ONSET OF MIGRAINE, MAY REPEAT ONCE IN 2 HOURS IF HEADACHE PERSISTS OR RECURS 9 tablet 3   Fluticasone -Salmeterol (ADVAIR) 250-50 MCG/DOSE AEPB Inhale 1 puff into the lungs 2 (two) times daily. 60 each 5   folic acid  (FOLVITE ) 1 MG tablet Take 1 tablet (1 mg total) by mouth daily. 30 tablet 5   furosemide  (LASIX ) 40 MG tablet Take 1 tablet (40 mg total) by mouth daily. 30 tablet 5   HYDROcodone-acetaminophen  (NORCO/VICODIN) 5-325 MG tablet Take by mouth.     hydrocortisone  (ANUSOL -HC) 2.5 % rectal cream Place 1 application rectally 2 (two) times daily. 30 g 1   ibuprofen   (ADVIL ,MOTRIN ) 800 MG tablet TAKE 1 TABLET BY MOUTH EVERY 8 HOURS AS NEEDED 90 tablet 2   ibuprofen  (ADVIL ,MOTRIN ) 800 MG tablet Take 1 tablet (800 mg total) by mouth every 8 (eight) hours as needed. 90 tablet 3   lamoTRIgine  (LAMICTAL ) 200 MG tablet Take 1 tablet (200 mg total) by mouth 2 (two) times daily. 180 tablet 0   metFORMIN  (GLUCOPHAGE ) 500 MG tablet Take 1 tablet (500 mg total) by mouth 2 (two) times daily with a meal. 60 tablet 1   methocarbamol  (ROBAXIN ) 500 MG tablet Take 1 tablet (500 mg total) by mouth every 8 (eight) hours as needed for muscle spasms. 30 tablet 1   methotrexate  2.5 MG tablet TAKE 8 TABLETS BY MOUTH ONCE A WEEK 32 tablet 5   naproxen  (NAPROSYN ) 500 MG tablet Take one with every sumatriptan  dose 60 tablet 1   pantoprazole  (PROTONIX ) 40 MG tablet TAKE 1 TABLET BY MOUTH ONCE DAILY 30 tablet 5   polyethylene glycol-electrolytes (TRILYTE) 420 g solution Take 4,000 mLs by mouth as directed. 4000 mL 0   promethazine  (PHENERGAN ) 25 MG tablet Take 1 tablet (25 mg total) by mouth every 6 (six) hours as needed for nausea or vomiting. 30 tablet 0   propranolol  ER (INDERAL  LA) 60 MG 24 hr capsule Take 1 capsule (60 mg total) by mouth daily. 30 capsule 2   SUMAtriptan  (IMITREX ) 100 MG tablet Take 1 tablet (100 mg total) by mouth once as needed for migraine. May repeat in 2 hours if headache persists or recurs. 10 tablet 2   vitamin B-12 (CYANOCOBALAMIN ) 1000 MCG tablet Take 500 mcg by mouth daily.     Vitamin D , Ergocalciferol , (DRISDOL ) 50000 units CAPS capsule TAKE 1 CAPSULE BY MOUTH TWICE A WEEK 16 capsule 0   No current facility-administered medications for this visit.      Psychiatric Specialty Exam: Review of Systems  Cardiovascular:  Negative for chest pain.  Musculoskeletal:  Positive for arthralgias and myalgias.  Skin:  Negative for rash.  Neurological:  Negative for tremors.  Psychiatric/Behavioral:  Negative for agitation, hallucinations and self-injury.  There were no vitals taken for this visit.There is no height or weight on file to calculate BMI.  General Appearance: Casual  Eye Contact:  Fair  Speech:  Clear and Coherent  Volume:  Normal  Mood: fair  Affect:  Congruent  Thought Process:  Goal Directed  Orientation:  Full (Time, Place, and Person)  Thought Content:  Rumination  Suicidal Thoughts:  No  Homicidal Thoughts:  No  Memory:  Immediate;   Fair Recent;   Fair  Judgement:  Fair  Insight:  Fair  Psychomotor Activity:  Decreased  Concentration:  Concentration: Fair and Attention Span: Fair  Recall:  [employer] of Knowledge:Good  Language: Good  Akathisia:  No  Handed:   AIMS (if indicated):  not done  Assets:  Desire for Improvement Social Support  ADL's:  Intact  Cognition: WNL  Sleep:  Fair to variable decreased   Screenings: Mini-Mental    Flowsheet Row Office Visit from 11/13/2016 in [facility]  Total Score (max 30 points ) 15   PHQ2-9    Flowsheet Row Counselor from 08/22/2020 in [facility] Video Visit from 06/30/2020 in [facility] Office Visit from 11/13/2016 in [facility] Office Visit from 11/06/2016 in [facility] Office Visit from 10/04/2016 in [facility]  PHQ-2 Total Score 2 2 6 6  0  PHQ-9 Total Score 16 8 24 24  --   Flowsheet Row Video Visit from 01/26/2022 in [facility] Video Visit from 10/27/2021 in [facility] Video Visit from 07/13/2021 in [facility] at [facility]  C-SSRS RISK CATEGORY No Risk No Risk No Risk    Assessment and Plan: as follows Prior documentation reviewed   Mood disorder possible  bipolar disorder, depressed: Manageable continue Lamictal , Wellbutrin  and Cymbalta   GAD: Varies depending on circumstances continue Cymbalta  she is not on Xanax  anymore continue BuSpar  for anxiety   PTSD: PTSD is baseline trying to focus on her now she does have current stressors but has a reasonable support system with her son continue Cymbalta  and supportive therapy  Reviewed medication questions addressed follow-up in 3 to 4 months or earlier if needed medication refills review was sent Jackey Flight, MD 9/2/20251:00 PM

## 2024-05-01 ENCOUNTER — Telehealth (HOSPITAL_COMMUNITY): Admitting: Psychiatry

## 2024-05-01 ENCOUNTER — Encounter (HOSPITAL_COMMUNITY): Payer: Self-pay

## 2024-05-11 ENCOUNTER — Encounter (HOSPITAL_COMMUNITY): Payer: Self-pay

## 2024-05-15 ENCOUNTER — Encounter (HOSPITAL_COMMUNITY): Payer: Self-pay | Admitting: Psychiatry

## 2024-05-15 ENCOUNTER — Telehealth (INDEPENDENT_AMBULATORY_CARE_PROVIDER_SITE_OTHER): Admitting: Psychiatry

## 2024-05-15 DIAGNOSIS — M797 Fibromyalgia: Secondary | ICD-10-CM

## 2024-05-15 DIAGNOSIS — F411 Generalized anxiety disorder: Secondary | ICD-10-CM | POA: Diagnosis not present

## 2024-05-15 DIAGNOSIS — F4310 Post-traumatic stress disorder, unspecified: Secondary | ICD-10-CM

## 2024-05-15 DIAGNOSIS — F321 Major depressive disorder, single episode, moderate: Secondary | ICD-10-CM

## 2024-05-15 MED ORDER — DULOXETINE HCL 60 MG PO CPEP: 60.0000 mg | ORAL_CAPSULE | Freq: Two times a day (BID) | ORAL | 1 refills | Status: AC

## 2024-05-15 MED ORDER — BREXPIPRAZOLE 1 MG PO TABS: 1.5000 mg | ORAL_TABLET | Freq: Every day | ORAL | 1 refills | Status: AC

## 2024-05-15 MED ORDER — LAMOTRIGINE 200 MG PO TABS: 200.0000 mg | ORAL_TABLET | Freq: Two times a day (BID) | ORAL | 0 refills | Status: AC

## 2024-05-15 MED ORDER — BUSPIRONE HCL 7.5 MG PO TABS: 7.5000 mg | ORAL_TABLET | Freq: Two times a day (BID) | ORAL | 1 refills | Status: AC

## 2024-05-15 MED ORDER — BUPROPION HCL ER (SR) 150 MG PO TB12: ORAL_TABLET | ORAL | 1 refills | Status: AC

## 2024-05-15 NOTE — Progress Notes (Signed)
 [facility] Follow up visit  Patient Identification: Jessica Vaughn MRN:  [id number] Date of Evaluation:  05/15/2024 Referral Source: [facility] Chief Complaint:  follow up depression, irregular sleep Visit Diagnosis:    ICD-10-CM   1. Current moderate episode of major depressive disorder without prior episode (HCC)  F32.1 lamoTRIgine  (LAMICTAL ) 200 MG tablet    2. GAD (generalized anxiety disorder)  F41.1     3. PTSD (post-traumatic stress disorder)  F43.10     4. Fibromyalgia  M79.7     Virtual Visit via Video Note  I connected with Tyresa Blackson on 05/15/24 at  9:40 AM EST by a video enabled telemedicine application and verified that I am speaking with the correct person using two identifiers.  Location: Patient: home Provider: home office   I discussed the limitations of evaluation and management by telemedicine and the availability of in person appointments. The patient expressed understanding and agreed to proceed.      I discussed the assessment and treatment plan with the patient. The patient was provided an opportunity to ask questions and all were answered. The patient agreed with the plan and demonstrated an understanding of the instructions.   The patient was advised to call back or seek an in-person evaluation if the symptoms worsen or if the condition fails to improve as anticipated.  I provided 20 minutes of non-face-to-face time during this encounter.   History of Present Illness: Patient is a 59 years old single Caucasian female initially  referred by pain clinic for management and to establish care for depression and possible bipolar.  she suffers from chronic pain rheumatoid arthritis  She has  multiple pain and co morbid conditions, including back condition and arthritis that effects her mobility, also had knee surgery   On evaluation today doing reasonable.  Her son is getting married in April she is going through back condition pain and  rheumatoid arthritis treatment  Has lost more than 100 pounds since started on the diabetic weight loss medication She feels good about that other than that tolerating medication no reported side effects  There is some irregularity in sleep recently so we talked about sleep hygiene  Since she is on pain medication advised not to get back on Xanax  and we can increase BuSpar  for anxiety symptoms she is expressing a during the daytime that could be affecting her sleep  There is no rash reported on Lamictal   Aggravating factors; co morbid medical conditions, fibromyalgia Modifying factors; avoids  Duration since young age Severity  manageable with some anxiety that is affecting sleep  Past Psychiatric History: depression, anxiety   Previous Psychotropic Medications: Yes     Past Medical History:  Past Medical History:  Diagnosis Date   Anxiety    Arthritis    COPD (chronic obstructive pulmonary disease) (HCC)    Depression    Neuromuscular disorder (HCC)    Osteoporosis    Seizures (HCC)    Sleep apnea    Ulcer     Past Surgical History:  Procedure Laterality Date   ROTATOR CUFF REPAIR Left 2009    Family Psychiatric History: mom : depression  Family History:  Family History  Problem Relation Age of Onset   Depression Mother    Cancer Father    Diabetes Father    Hypertension Father    Drug abuse Sister    Colon cancer Neg Hx     Social History:   Social History   Socioeconomic History   Marital  status: Divorced    Spouse name: Not on file   Number of children: 2   Years of education: Not on file   Highest education level: Not on file  Occupational History   Not on file  Tobacco Use   Smoking status: Every Day    Current packs/day: 0.50    Types: Cigarettes   Smokeless tobacco: Never  Vaping Use   Vaping status: Never Used  Substance and Sexual Activity   Alcohol  use: No   Drug use: No   Sexual activity: Yes    Birth control/protection:  Post-menopausal  Other Topics Concern   Not on file  Social History Narrative   Lives with 2 sons, 1 is disabled. Moved her recently for [place].    Social Drivers of Health   Tobacco Use: High Risk (05/15/2024)   Patient History    Smoking Tobacco Use: Every Day    Smokeless Tobacco Use: Never    Passive Exposure: Not on file  Financial Resource Strain: Medium Risk (06/29/2023)   Received from [facility]   Overall Financial Resource Strain (CARDIA)    Difficulty of Paying Living Expenses: Somewhat hard  Food Insecurity: Food Insecurity Present (06/29/2023)   Received from [facility]   Epic    Within the past 12 months, you worried that your food would run out before you got the money to buy more.: Sometimes true    Within the past 12 months, the food you bought just didn't last and you didn't have money to get more.: Sometimes true  Transportation Needs: Unmet Transportation Needs (06/29/2023)   Received from [facility] - Transportation    Lack of Transportation (Medical): Yes    Lack of Transportation (Non-Medical): Patient declined  Physical Activity: Unknown (06/29/2023)   Received from [facility]   Exercise Vital Sign    On average, how many days per week do you engage in moderate to strenuous exercise (like a brisk walk)?: 0 days    Minutes of Exercise per Session: Not on file  Stress: Stress Concern Present (06/29/2023)   Received from [facility] - Occupational Stress Questionnaire    Feeling of Stress : Very much  Social Connections: Socially Isolated (06/29/2023)   Received from [facility]   Social Network    How would you rate your social network (family, work, friends)?: Little participation, lonely and socially isolated  Depression (PHQ2-9): Not on file  Alcohol  Screen: Not on file  Housing: High Risk (06/29/2023)   Received from [facility]    In the last 12 months, was there a time when  you were not able to pay the mortgage or rent on time?: Yes    In the past 12 months, how many times have you moved where you were living?: 0    At any time in the past 12 months, were you homeless or living in a shelter (including now)?: No  Utilities: Not At Risk (06/29/2023)   Received from [facility] Utilities    Threatened with loss of utilities: No  Health Literacy: Not on file      Allergies:   Allergies  Allergen Reactions   Meloxicam  Other (See Comments)    Interacts with other meds   Tramadol Other (See Comments)    Interacts with other meds   Robaxin  [Methocarbamol ]    Zanaflex [Tizanidine Hcl]     Metabolic Disorder Labs: Lab  Results  Component Value Date   HGBA1C 5.5 11/06/2016   No results found for: PROLACTIN Lab Results  Component Value Date   CHOL 238 (H) 11/06/2016   TRIG 144 11/06/2016   HDL 52 11/06/2016   CHOLHDL 4.6 (H) 11/06/2016   LDLCALC 157 (H) 11/06/2016   Lab Results  Component Value Date   TSH 1.740 11/13/2016    Therapeutic Level Labs: No results found for: LITHIUM No results found for: CBMZ No results found for: VALPROATE  Current Medications: Current Outpatient Medications  Medication Sig Dispense Refill   albuterol  (PROVENTIL  HFA;VENTOLIN  HFA) 108 (90 Base) MCG/ACT inhaler Inhale 1 puff into the lungs every 6 (six) hours as needed for wheezing or shortness of breath. 1 Inhaler 6   AMITIZA  24 MCG capsule TAKE 1 CAPSULE BY MOUTH TWICE DAILY WITH MEALS 60 capsule 5   atorvastatin  (LIPITOR) 40 MG tablet Take 1 tablet (40 mg total) by mouth daily. 90 tablet 3   baclofen (LIORESAL) 10 MG tablet Take 10 mg by mouth 3 (three) times daily.     brexpiprazole  (REXULTI ) 1 MG TABS tablet Take 1.5 tablets (1.5 mg total) by mouth at bedtime. 45 tablet 1   buPROPion  (WELLBUTRIN  SR) 150 MG 12 hr tablet SMARTSIG:1 Tablet(s) By Mouth Every 12 Hours 30 tablet 1   busPIRone  (BUSPAR ) 7.5 MG tablet Take 1 tablet (7.5 mg total) by mouth  2 (two) times daily. 60 tablet 1   cholecalciferol (VITAMIN D ) 1000 units tablet Take 1 tablet (1,000 Units total) by mouth daily. 30 tablet 5   DULoxetine  (CYMBALTA ) 60 MG capsule Take 1 capsule (60 mg total) by mouth 2 (two) times daily. 60 capsule 1   eletriptan  (RELPAX ) 40 MG tablet TAKE ONE TABLET BY MOUTH AT EARLIEST ONSET OF MIGRAINE, MAY REPEAT ONCE IN 2 HOURS IF HEADACHE PERSISTS OR RECURS 9 tablet 3   Fluticasone -Salmeterol (ADVAIR) 250-50 MCG/DOSE AEPB Inhale 1 puff into the lungs 2 (two) times daily. 60 each 5   folic acid  (FOLVITE ) 1 MG tablet Take 1 tablet (1 mg total) by mouth daily. 30 tablet 5   furosemide  (LASIX ) 40 MG tablet Take 1 tablet (40 mg total) by mouth daily. 30 tablet 5   HYDROcodone-acetaminophen  (NORCO/VICODIN) 5-325 MG tablet Take by mouth.     hydrocortisone  (ANUSOL -HC) 2.5 % rectal cream Place 1 application rectally 2 (two) times daily. 30 g 1   ibuprofen  (ADVIL ,MOTRIN ) 800 MG tablet TAKE 1 TABLET BY MOUTH EVERY 8 HOURS AS NEEDED 90 tablet 2   ibuprofen  (ADVIL ,MOTRIN ) 800 MG tablet Take 1 tablet (800 mg total) by mouth every 8 (eight) hours as needed. 90 tablet 3   lamoTRIgine  (LAMICTAL ) 200 MG tablet Take 1 tablet (200 mg total) by mouth 2 (two) times daily. 180 tablet 0   metFORMIN  (GLUCOPHAGE ) 500 MG tablet Take 1 tablet (500 mg total) by mouth 2 (two) times daily with a meal. 60 tablet 1   methocarbamol  (ROBAXIN ) 500 MG tablet Take 1 tablet (500 mg total) by mouth every 8 (eight) hours as needed for muscle spasms. 30 tablet 1   methotrexate  2.5 MG tablet TAKE 8 TABLETS BY MOUTH ONCE A WEEK 32 tablet 5   naproxen  (NAPROSYN ) 500 MG tablet Take one with every sumatriptan  dose 60 tablet 1   pantoprazole  (PROTONIX ) 40 MG tablet TAKE 1 TABLET BY MOUTH ONCE DAILY 30 tablet 5   polyethylene glycol-electrolytes (TRILYTE) 420 g solution Take 4,000 mLs by mouth as directed. 4000 mL 0   promethazine  (PHENERGAN )  25 MG tablet Take 1 tablet (25 mg total) by mouth every 6  (six) hours as needed for nausea or vomiting. 30 tablet 0   propranolol  ER (INDERAL  LA) 60 MG 24 hr capsule Take 1 capsule (60 mg total) by mouth daily. 30 capsule 2   SUMAtriptan  (IMITREX ) 100 MG tablet Take 1 tablet (100 mg total) by mouth once as needed for migraine. May repeat in 2 hours if headache persists or recurs. 10 tablet 2   vitamin B-12 (CYANOCOBALAMIN ) 1000 MCG tablet Take 500 mcg by mouth daily.     Vitamin D , Ergocalciferol , (DRISDOL ) 50000 units CAPS capsule TAKE 1 CAPSULE BY MOUTH TWICE A WEEK 16 capsule 0   No current facility-administered medications for this visit.      Psychiatric Specialty Exam: Review of Systems  Cardiovascular:  Negative for chest pain.  Musculoskeletal:  Positive for arthralgias and myalgias.  Skin:  Negative for rash.  Neurological:  Negative for tremors.  Psychiatric/Behavioral:  Negative for agitation, hallucinations and self-injury.     There were no vitals taken for this visit.There is no height or weight on file to calculate BMI.  General Appearance: Casual  Eye Contact:  Fair  Speech:  Clear and Coherent  Volume:  Normal  Mood: fair  Affect:  Congruent  Thought Process:  Goal Directed  Orientation:  Full (Time, Place, and Person)  Thought Content:  Rumination  Suicidal Thoughts:  No  Homicidal Thoughts:  No  Memory:  Immediate;   Fair Recent;   Fair  Judgement:  Fair  Insight:  Fair  Psychomotor Activity:  Decreased  Concentration:  Concentration: Fair and Attention Span: Fair  Recall:  [employer] of Knowledge:Good  Language: Good  Akathisia:  No  Handed:   AIMS (if indicated):  not done  Assets:  Desire for Improvement Social Support  ADL's:  Intact  Cognition: WNL  Sleep:  Fair to variable decreased   Screenings: Mini-Mental    Flowsheet Row Office Visit from 11/13/2016 in [facility]  Total Score (max 30 points ) 15   PHQ2-9    Flowsheet Row Counselor from 08/22/2020 in [facility] Video Visit from 06/30/2020 in [facility] Office Visit from 11/13/2016 in [facility] Office Visit from 11/06/2016 in [facility] Office Visit from 10/04/2016 in [facility]  PHQ-2 Total Score 2 2 6 6  0  PHQ-9 Total Score 16 8 24 24  --   Flowsheet Row Video Visit from 01/26/2022 in [facility] at [facility] Video Visit from 10/27/2021 in [facility] at [facility] Video Visit from 07/13/2021 in [facility] at [facility]  C-SSRS RISK CATEGORY No Risk No Risk No Risk    Assessment and Plan: as follows Prior documentation reviewed   Mood disorder possible bipolar disorder, depressed: Manageable continue Wellbutrin , Cymbalta , Lamictal    GAD: Fluctuates continue Cymbalta .  Increase BuSpar  to 2 times a day  PTSD: Baseline continue work on distraction of negative thoughts on Cymbalta   Insomnia; reviewed sleep hygiene add BuSpar  twice a day at Reviewed medication questions addressed follow-up in 3 to 4 months or earlier if needed medication refills review was sent Jackey Flight, MD 1/23/20269:48 AM

## 2024-05-19 ENCOUNTER — Telehealth (HOSPITAL_COMMUNITY): Payer: Self-pay

## 2024-05-19 NOTE — Telephone Encounter (Signed)
 Medication management - Call with patient's [facility] to inform of received authorization approval for patient's prescribed Rexulti  1 mg, good until 04/22/25 and pharmacist verified this was now able to be filled.

## 2024-08-14 ENCOUNTER — Telehealth (HOSPITAL_COMMUNITY): Admitting: Psychiatry
